# Patient Record
Sex: Female | Born: 1955 | Race: White | Hispanic: No | Marital: Married | Smoking: Former smoker
Health system: Southern US, Community
[De-identification: ages and names within clinical notes are randomized; demographics above are authoritative.]

## PROBLEM LIST (undated history)

## (undated) DIAGNOSIS — E079 Disorder of thyroid, unspecified: Secondary | ICD-10-CM

## (undated) DIAGNOSIS — F329 Major depressive disorder, single episode, unspecified: Secondary | ICD-10-CM

## (undated) DIAGNOSIS — F419 Anxiety disorder, unspecified: Secondary | ICD-10-CM

## (undated) DIAGNOSIS — T7840XA Allergy, unspecified, initial encounter: Secondary | ICD-10-CM

## (undated) DIAGNOSIS — F32A Depression, unspecified: Secondary | ICD-10-CM

## (undated) HISTORY — DX: Anxiety disorder, unspecified: F41.9

## (undated) HISTORY — PX: DILATION AND CURETTAGE OF UTERUS: SHX78

## (undated) HISTORY — DX: Depression, unspecified: F32.A

## (undated) HISTORY — DX: Allergy, unspecified, initial encounter: T78.40XA

## (undated) HISTORY — DX: Disorder of thyroid, unspecified: E07.9

## (undated) HISTORY — DX: Major depressive disorder, single episode, unspecified: F32.9

## (undated) HISTORY — PX: OTHER SURGICAL HISTORY: SHX169

## (undated) HISTORY — PX: VARICOSE VEIN SURGERY: SHX832

---

## 1997-10-29 ENCOUNTER — Ambulatory Visit (HOSPITAL_COMMUNITY): Admission: RE | Admit: 1997-10-29 | Discharge: 1997-10-29 | Payer: Self-pay | Admitting: Family Medicine

## 1999-03-17 ENCOUNTER — Encounter (INDEPENDENT_AMBULATORY_CARE_PROVIDER_SITE_OTHER): Payer: Self-pay | Admitting: Specialist

## 1999-03-17 ENCOUNTER — Ambulatory Visit (HOSPITAL_COMMUNITY): Admission: RE | Admit: 1999-03-17 | Discharge: 1999-03-17 | Payer: Self-pay | Admitting: Obstetrics and Gynecology

## 1999-12-06 ENCOUNTER — Emergency Department (HOSPITAL_COMMUNITY): Admission: EM | Admit: 1999-12-06 | Discharge: 1999-12-06 | Payer: Self-pay | Admitting: Emergency Medicine

## 2001-12-28 ENCOUNTER — Other Ambulatory Visit: Admission: RE | Admit: 2001-12-28 | Discharge: 2001-12-28 | Payer: Self-pay | Admitting: Gynecology

## 2003-01-17 ENCOUNTER — Other Ambulatory Visit: Admission: RE | Admit: 2003-01-17 | Discharge: 2003-01-17 | Payer: Self-pay | Admitting: Gynecology

## 2004-02-20 ENCOUNTER — Other Ambulatory Visit: Admission: RE | Admit: 2004-02-20 | Discharge: 2004-02-20 | Payer: Self-pay | Admitting: Gynecology

## 2004-04-23 ENCOUNTER — Ambulatory Visit: Payer: Self-pay | Admitting: Family Medicine

## 2004-05-07 ENCOUNTER — Ambulatory Visit: Payer: Self-pay | Admitting: Gastroenterology

## 2004-06-11 ENCOUNTER — Ambulatory Visit: Payer: Self-pay | Admitting: Gastroenterology

## 2004-06-25 ENCOUNTER — Ambulatory Visit: Payer: Self-pay | Admitting: Gastroenterology

## 2004-12-04 ENCOUNTER — Ambulatory Visit: Payer: Self-pay | Admitting: Family Medicine

## 2005-05-27 ENCOUNTER — Other Ambulatory Visit: Admission: RE | Admit: 2005-05-27 | Discharge: 2005-05-27 | Payer: Self-pay | Admitting: Gynecology

## 2005-08-17 ENCOUNTER — Ambulatory Visit: Payer: Self-pay | Admitting: Family Medicine

## 2006-07-07 ENCOUNTER — Other Ambulatory Visit: Admission: RE | Admit: 2006-07-07 | Discharge: 2006-07-07 | Payer: Self-pay | Admitting: Gynecology

## 2007-10-11 ENCOUNTER — Other Ambulatory Visit: Admission: RE | Admit: 2007-10-11 | Discharge: 2007-10-11 | Payer: Self-pay | Admitting: Gynecology

## 2007-10-11 ENCOUNTER — Ambulatory Visit: Payer: Self-pay | Admitting: Gynecology

## 2007-10-11 ENCOUNTER — Encounter: Payer: Self-pay | Admitting: Gynecology

## 2007-10-25 ENCOUNTER — Ambulatory Visit: Payer: Self-pay | Admitting: Gynecology

## 2008-01-25 ENCOUNTER — Ambulatory Visit: Payer: Self-pay | Admitting: Gynecology

## 2008-11-28 ENCOUNTER — Other Ambulatory Visit: Admission: RE | Admit: 2008-11-28 | Discharge: 2008-11-28 | Payer: Self-pay | Admitting: Gynecology

## 2008-11-28 ENCOUNTER — Encounter: Payer: Self-pay | Admitting: Gynecology

## 2008-11-28 ENCOUNTER — Ambulatory Visit: Payer: Self-pay | Admitting: Gynecology

## 2009-12-25 ENCOUNTER — Ambulatory Visit: Payer: Self-pay | Admitting: Gynecology

## 2009-12-25 ENCOUNTER — Other Ambulatory Visit: Admission: RE | Admit: 2009-12-25 | Discharge: 2009-12-25 | Payer: Self-pay | Admitting: Gynecology

## 2009-12-29 ENCOUNTER — Ambulatory Visit: Payer: Self-pay | Admitting: Gynecology

## 2010-01-29 ENCOUNTER — Ambulatory Visit: Payer: Self-pay | Admitting: Gynecology

## 2010-02-28 ENCOUNTER — Encounter: Payer: Self-pay | Admitting: Family Medicine

## 2010-03-02 ENCOUNTER — Encounter: Payer: Self-pay | Admitting: Gynecology

## 2010-06-26 NOTE — Op Note (Signed)
[facility]  Patient:    Nancy Chambers                          MRN: [id number] Proc. Date: 03/17/99 Adm. Date:  [id number] Attending:  Amanda Cockayne                           Operative Report  PREOPERATIVE DIAGNOSIS:       Heavy menstrual periods and she has had some questionable spotting.  She had hysterosonogram which showed endometrial lining was 14 mm and she had a posterior wall echogenic focus of 11 x 7 mm which was thought to be a polyp.  POSTOPERATIVE DIAGNOSIS:      Heavy menstrual periods and she has had some questionable spotting.  She had hysterosonogram which showed endometrial lining was 14 mm and she had a posterior wall echogenic focus of 11 x 7 mm which was thought to be a polyp.   Posterior polyp and thickened lining.  OPERATION:                    Hysteroscopy, polypectomy, and D&C.  SURGEON:                      Esmeralda Arthur, M.D.  ASSISTANT:  ANESTHESIA:                   General anesthesia.  PACKS:                        None.  CATHETERS:                    None.  MEDIUM:                       Sorbitol, deficit 90 cc.  FINDINGS:                     On bimanual the uterus was enlarged.  The uterus sounded to 8 cm.  She had an irregular endometrial cavity and a posterior polyp.  ESTIMATED BLOOD LOSS:  DESCRIPTION OF PROCEDURE:     The patient was carried to the operating room and  after satisfactory anesthesia, she was placed in the lithotomy position, prepped and draped, and the bladder was emptied by catheterization.  The legs were elevated more and plastic drape was placed on.  The cervix was then grasped with a single tooth tenaculum and sounded to 8 cm and then dilated to a #33 Hegar.  The observation scope was placed in.  I could see the irregular endometrium and a small polyp posteriorly.  Her tubal openings were visualized easily.  Then we did a sharp and serrated curettage and removed  the polyp.  The endometrial lining.  I looked again and could see no other abnormalities.  The procedure was terminated. DD:  03/17/99 TD:  03/17/99 Job: [id number]

## 2011-08-05 ENCOUNTER — Encounter: Payer: Self-pay | Admitting: Gynecology

## 2012-08-10 ENCOUNTER — Encounter: Payer: Self-pay | Admitting: Gynecology

## 2012-08-10 ENCOUNTER — Other Ambulatory Visit (HOSPITAL_COMMUNITY)
Admission: RE | Admit: 2012-08-10 | Discharge: 2012-08-10 | Disposition: A | Discharge status: Home / Self Care | Payer: 59 | Source: Ambulatory Visit | Attending: Gynecology | Admitting: Gynecology

## 2012-08-10 ENCOUNTER — Ambulatory Visit (INDEPENDENT_AMBULATORY_CARE_PROVIDER_SITE_OTHER): Payer: 59 | Admitting: Gynecology

## 2012-08-10 VITALS — BP 114/66 | Ht 66.0 in | Wt 189.0 lb

## 2012-08-10 DIAGNOSIS — Z01419 Encounter for gynecological examination (general) (routine) without abnormal findings: Secondary | ICD-10-CM | POA: Insufficient documentation

## 2012-08-10 DIAGNOSIS — Z1151 Encounter for screening for human papillomavirus (HPV): Secondary | ICD-10-CM | POA: Insufficient documentation

## 2012-08-10 LAB — TSH: TSH: 3.033 u[IU]/mL (ref 0.350–4.500)

## 2012-08-10 LAB — CBC WITH DIFFERENTIAL/PLATELET
Basophils Absolute: 0 10*3/uL (ref 0.0–0.1)
Basophils Relative: 1 % (ref 0–1)
Eosinophils Absolute: 0.2 10*3/uL (ref 0.0–0.7)
Eosinophils Relative: 4 % (ref 0–5)
HCT: 38.9 % (ref 36.0–46.0)
Hemoglobin: 13 g/dL (ref 12.0–15.0)
Lymphocytes Relative: 35 % (ref 12–46)
Lymphs Abs: 1.7 10*3/uL (ref 0.7–4.0)
MCH: 29.4 pg (ref 26.0–34.0)
MCHC: 33.4 g/dL (ref 30.0–36.0)
MCV: 88 fL (ref 78.0–100.0)
Monocytes Absolute: 0.3 10*3/uL (ref 0.1–1.0)
Monocytes Relative: 6 % (ref 3–12)
Neutro Abs: 2.7 10*3/uL (ref 1.7–7.7)
Neutrophils Relative %: 54 % (ref 43–77)
Platelets: 253 10*3/uL (ref 150–400)
RBC: 4.42 MIL/uL (ref 3.87–5.11)
RDW: 13.9 % (ref 11.5–15.5)
WBC: 5 10*3/uL (ref 4.0–10.5)

## 2012-08-10 LAB — COMPREHENSIVE METABOLIC PANEL
ALT: 13 U/L (ref 0–35)
AST: 14 U/L (ref 0–37)
Albumin: 4.4 g/dL (ref 3.5–5.2)
Alkaline Phosphatase: 56 U/L (ref 39–117)
BUN: 14 mg/dL (ref 6–23)
CO2: 28 mEq/L (ref 19–32)
Calcium: 9.3 mg/dL (ref 8.4–10.5)
Chloride: 104 mEq/L (ref 96–112)
Creat: 0.88 mg/dL (ref 0.50–1.10)
Glucose, Bld: 82 mg/dL (ref 70–99)
Potassium: 4.4 mEq/L (ref 3.5–5.3)
Sodium: 142 mEq/L (ref 135–145)
Total Bilirubin: 0.5 mg/dL (ref 0.3–1.2)
Total Protein: 6.3 g/dL (ref 6.0–8.3)

## 2012-08-10 NOTE — Addendum Note (Signed)
Addended by: Dayna Barker on: 08/10/2012 11:21 AM   Modules accepted: Orders

## 2012-08-10 NOTE — Patient Instructions (Addendum)
Follow up in one year for annual exam 

## 2012-08-10 NOTE — Progress Notes (Signed)
Nancy Chambers [birth year] [id number]        57 y.o.  G1P1001 for annual exam.  Doing well without complaints.  Past medical history,surgical history, medications, allergies, family history and social history were all reviewed and documented in the EPIC chart.  ROS:  Performed and pertinent positives and negatives are included in the history, assessment and plan .  Exam: Kim assistant Filed Vitals:   08/10/12 1039  BP: 114/66  Height: 5\' 6"  (1.676 m)  Weight: 189 lb (85.73 kg)   General appearance  Normal Skin grossly normal Head/Neck normal with no cervical or supraclavicular adenopathy thyroid normal Lungs  clear Cardiac RR, without RMG Abdominal  soft, nontender, without masses, organomegaly or hernia Breasts  examined lying and sitting without masses, retractions, discharge or axillary adenopathy. Pelvic  Ext/BUS/vagina  normal with mild atrophic changes  Cervix  normal with mild cervical stenosis noted with Pap  Uterus  anteverted, normal size, shape and contour, midline and mobile nontender   Adnexa  Without masses or tenderness    Anus and perineum  normal   Rectovaginal  normal sphincter tone without palpated masses or tenderness.    Assessment/Plan:  57 y.o. G94P1001 female for annual exam.   1. Postmenopausal. Patient doing well without bleeding or significant hot flashes, night sweats, vaginal dryness or dyspareunia. We'll continue to monitor. Patient knows to report any bleeding 2. Questionable height loss of 1/2 inch of the patient's not sure. We'll check baseline DEXA. Increase calcium vitamin D reviewed. 3. Pap smear 2011. Pap/HPV today. No history of significant abnormalities. 4. Mammography 07/2011. Patient scheduling repeat now. We'll continue with annual mammography. SBE monthly reviewed. 5. Colonoscopy 5 years ago. Recommended repeat in 10 years. 6. Health maintenance. Had recent lipid profile for insurance which was normal. Baseline CBC comprehensive metabolic  panel TSH vitamin D and urinalysis ordered.  Note: This document was prepared with digital dictation and possible smart phrase technology. Any transcriptional errors that result from this process are unintentional.   Dara Lords MD, 11:06 AM 08/10/2012

## 2012-08-11 LAB — URINALYSIS W MICROSCOPIC + REFLEX CULTURE
Bacteria, UA: NONE SEEN
Bilirubin Urine: NEGATIVE
Crystals: NONE SEEN
Hgb urine dipstick: NEGATIVE
Ketones, ur: NEGATIVE mg/dL
Nitrite: NEGATIVE
Specific Gravity, Urine: 1.023 (ref 1.005–1.030)
Urobilinogen, UA: 0.2 mg/dL (ref 0.0–1.0)

## 2012-08-11 LAB — VITAMIN D 25 HYDROXY (VIT D DEFICIENCY, FRACTURES): Vit D, 25-Hydroxy: 49 ng/mL (ref 30–89)

## 2012-08-23 ENCOUNTER — Telehealth: Payer: Self-pay | Admitting: *Deleted

## 2012-08-23 NOTE — Telephone Encounter (Signed)
Pt informed with all normal results on 08/10/12

## 2012-12-14 ENCOUNTER — Other Ambulatory Visit: Payer: Self-pay

## 2013-08-16 ENCOUNTER — Encounter: Payer: Self-pay | Admitting: Gynecology

## 2013-08-16 ENCOUNTER — Ambulatory Visit (INDEPENDENT_AMBULATORY_CARE_PROVIDER_SITE_OTHER): Payer: 59 | Admitting: Gynecology

## 2013-08-16 VITALS — BP 120/76 | Ht 66.0 in | Wt 206.0 lb

## 2013-08-16 DIAGNOSIS — Z01419 Encounter for gynecological examination (general) (routine) without abnormal findings: Secondary | ICD-10-CM

## 2013-08-16 LAB — CBC WITH DIFFERENTIAL/PLATELET
BASOS ABS: 0 10*3/uL (ref 0.0–0.1)
Basophils Relative: 1 % (ref 0–1)
EOS PCT: 3 % (ref 0–5)
Eosinophils Absolute: 0.1 10*3/uL (ref 0.0–0.7)
HEMATOCRIT: 38.1 % (ref 36.0–46.0)
HEMOGLOBIN: 13.1 g/dL (ref 12.0–15.0)
LYMPHS PCT: 45 % (ref 12–46)
Lymphs Abs: 2 10*3/uL (ref 0.7–4.0)
MCH: 30 pg (ref 26.0–34.0)
MCHC: 34.4 g/dL (ref 30.0–36.0)
MCV: 87.4 fL (ref 78.0–100.0)
MONO ABS: 0.3 10*3/uL (ref 0.1–1.0)
MONOS PCT: 6 % (ref 3–12)
Neutro Abs: 2 10*3/uL (ref 1.7–7.7)
Neutrophils Relative %: 45 % (ref 43–77)
Platelets: 223 10*3/uL (ref 150–400)
RBC: 4.36 MIL/uL (ref 3.87–5.11)
RDW: 14.1 % (ref 11.5–15.5)
WBC: 4.4 10*3/uL (ref 4.0–10.5)

## 2013-08-16 NOTE — Progress Notes (Signed)
Nancy HarriesRobin C Chambers 10/08/1955 [id number]        58 y.o.  G1P1001 for annual exam.  Several issues noted below.  Past medical history,surgical history, problem list, medications, allergies, family history and social history were all reviewed and documented as reviewed in the EPIC chart.  ROS:  12 system ROS performed with pertinent positives and negatives included in the history, assessment and plan.   Additional significant findings :  None   Exam: [facility] personassistant Filed Vitals:   08/16/13 1446  BP: 120/76  Height: 5\' 6"  (1.676 m)  Weight: 206 lb (93.441 kg)   General appearance:  Normal affect, orientation and appearance. Skin: Grossly normal HEENT: Without gross lesions.  No cervical or supraclavicular adenopathy. Thyroid normal.  Lungs:  Clear without wheezing, rales or rhonchi Cardiac: RR, without RMG Abdominal:  Soft, nontender, without masses, guarding, rebound, organomegaly or hernia Breasts:  Examined lying and sitting without masses, retractions, discharge or axillary adenopathy. Pelvic:  Ext/BUS/vagina with generalized atrophic change in  Cervix atrophic.  Uterus anteverted, normal size, shape and contour, midline and mobile nontender   Adnexa  Without masses or tenderness    Anus and perineum  Normal   Rectovaginal  Normal sphincter tone without palpated masses or tenderness.    Assessment/Plan:  58 y.o. 561P1001 female for annual exam.   1. Postmenopausal/atrophic genital changes. Patient without significant symptoms of hot flushes, night sweats, vaginal dryness. Is not currently sexually active. No vaginal bleeding. Continue to monitor. Call if any vaginal bleeding. 2. Pap smear/HPV negative 2014. No Pap smear done today. No history of significant abnormal Pap smears. Plan repeat at 3-5 year interval per current screening guidelines. 3. Mammography 2013. Patient knows she's overdue and agrees to call and schedule. SBE monthly reviewed. 4. DEXA 9 years ago. Was to schedule last  year but failed to followup with this. Reports a half an inch height loss. Plan DEXA now. 5. Colonoscopy 6 years ago. Repeat at their recommended interval. 6. Health maintenance. Baseline CBC comprehensive metabolic panel lipid profile urinalysis TSH vitamin D ordered. Followup for bone density otherwise one year, sooner if any issues.   Note: This document was prepared with digital dictation and possible smart phrase technology. Any transcriptional errors that result from this process are unintentional.   Dara LordsFONTAINE,Gulianna Hornsby P MD, 3:09 PM 08/16/2013

## 2013-08-16 NOTE — Patient Instructions (Signed)
Followup in one year for annual exam, sooner if issues.  You may obtain a copy of any labs that were done today by logging onto MyChart as outlined in the instructions provided with your AVS (after visit summary). The office will not call with normal lab results but certainly if there are any significant abnormalities then we will contact you.   Health Maintenance, Female A healthy lifestyle and preventative care can promote health and wellness.  Maintain regular health, dental, and eye exams.  Eat a healthy diet. Foods like vegetables, fruits, whole grains, low-fat dairy products, and lean protein foods contain the nutrients you need without too many calories. Decrease your intake of foods high in solid fats, added sugars, and salt. Get information about a proper diet from your caregiver, if necessary.  Regular physical exercise is one of the most important things you can do for your health. Most adults should get at least 150 minutes of moderate-intensity exercise (any activity that increases your heart rate and causes you to sweat) each week. In addition, most adults need muscle-strengthening exercises on 2 or more days a week.   Maintain a healthy weight. The body mass index (BMI) is a screening tool to identify possible weight problems. It provides an estimate of body fat based on height and weight. Your caregiver can help determine your BMI, and can help you achieve or maintain a healthy weight. For adults 20 years and older:  A BMI below 18.5 is considered underweight.  A BMI of 18.5 to 24.9 is normal.  A BMI of 25 to 29.9 is considered overweight.  A BMI of 30 and above is considered obese.  Maintain normal blood lipids and cholesterol by exercising and minimizing your intake of saturated fat. Eat a balanced diet with plenty of fruits and vegetables. Blood tests for lipids and cholesterol should begin at age 4 and be repeated every 5 years. If your lipid or cholesterol levels are  high, you are over 50, or you are a high risk for heart disease, you may need your cholesterol levels checked more frequently.Ongoing high lipid and cholesterol levels should be treated with medicines if diet and exercise are not effective.  If you smoke, find out from your caregiver how to quit. If you do not use tobacco, do not start.  Lung cancer screening is recommended for adults aged 61 80 years who are at high risk for developing lung cancer because of a history of smoking. Yearly low-dose computed tomography (CT) is recommended for people who have at least a 30-pack-year history of smoking and are a current smoker or have quit within the past 15 years. A pack year of smoking is smoking an average of 1 pack of cigarettes a day for 1 year (for example: 1 pack a day for 30 years or 2 packs a day for 15 years). Yearly screening should continue until the smoker has stopped smoking for at least 15 years. Yearly screening should also be stopped for people who develop a health problem that would prevent them from having lung cancer treatment.  If you are pregnant, do not drink alcohol. If you are breastfeeding, be very cautious about drinking alcohol. If you are not pregnant and choose to drink alcohol, do not exceed 1 drink per day. One drink is considered to be 12 ounces (355 mL) of beer, 5 ounces (148 mL) of wine, or 1.5 ounces (44 mL) of liquor.  Avoid use of street drugs. Do not share needles with anyone.  Ask for help if you need support or instructions about stopping the use of drugs.  High blood pressure causes heart disease and increases the risk of stroke. Blood pressure should be checked at least every 1 to 2 years. Ongoing high blood pressure should be treated with medicines, if weight loss and exercise are not effective.  If you are 88 to 56 years old, ask your caregiver if you should take aspirin to prevent strokes.  Diabetes screening involves taking a blood sample to check your fasting  blood sugar level. This should be done once every 3 years, after age 77, if you are within normal weight and without risk factors for diabetes. Testing should be considered at a younger age or be carried out more frequently if you are overweight and have at least 1 risk factor for diabetes.  Breast cancer screening is essential preventative care for women. You should practice "breast self-awareness." This means understanding the normal appearance and feel of your breasts and may include breast self-examination. Any changes detected, no matter how small, should be reported to a caregiver. Women in their 26s and 30s should have a clinical breast exam (CBE) by a caregiver as part of a regular health exam every 1 to 3 years. After age 32, women should have a CBE every year. Starting at age 34, women should consider having a mammogram (breast X-ray) every year. Women who have a family history of breast cancer should talk to their caregiver about genetic screening. Women at a high risk of breast cancer should talk to their caregiver about having an MRI and a mammogram every year.  Breast cancer gene (BRCA)-related cancer risk assessment is recommended for women who have family members with BRCA-related cancers. BRCA-related cancers include breast, ovarian, tubal, and peritoneal cancers. Having family members with these cancers may be associated with an increased risk for harmful changes (mutations) in the breast cancer genes BRCA1 and BRCA2. Results of the assessment will determine the need for genetic counseling and BRCA1 and BRCA2 testing.  The Pap test is a screening test for cervical cancer. Women should have a Pap test starting at age 28. Between ages 48 and 67, Pap tests should be repeated every 2 years. Beginning at age 20, you should have a Pap test every 3 years as long as the past 3 Pap tests have been normal. If you had a hysterectomy for a problem that was not cancer or a condition that could lead to  cancer, then you no longer need Pap tests. If you are between ages 55 and 63, and you have had normal Pap tests going back 10 years, you no longer need Pap tests. If you have had past treatment for cervical cancer or a condition that could lead to cancer, you need Pap tests and screening for cancer for at least 20 years after your treatment. If Pap tests have been discontinued, risk factors (such as a new sexual partner) need to be reassessed to determine if screening should be resumed. Some women have medical problems that increase the chance of getting cervical cancer. In these cases, your caregiver may recommend more frequent screening and Pap tests.  The human papillomavirus (HPV) test is an additional test that may be used for cervical cancer screening. The HPV test looks for the virus that can cause the cell changes on the cervix. The cells collected during the Pap test can be tested for HPV. The HPV test could be used to screen women aged 28 years  and older, and should be used in women of any age who have unclear Pap test results. After the age of 85, women should have HPV testing at the same frequency as a Pap test.  Colorectal cancer can be detected and often prevented. Most routine colorectal cancer screening begins at the age of 61 and continues through age 55. However, your caregiver may recommend screening at an earlier age if you have risk factors for colon cancer. On a yearly basis, your caregiver may provide home test kits to check for hidden blood in the stool. Use of a small camera at the end of a tube, to directly examine the colon (sigmoidoscopy or colonoscopy), can detect the earliest forms of colorectal cancer. Talk to your caregiver about this at age 56, when routine screening begins. Direct examination of the colon should be repeated every 5 to 10 years through age 20, unless early forms of pre-cancerous polyps or small growths are found.  Hepatitis C blood testing is recommended for  all people born from [birth year] through 1965 and any individual with known risks for hepatitis C.  Practice safe sex. Use condoms and avoid high-risk sexual practices to reduce the spread of sexually transmitted infections (STIs). Sexually active women aged 68 and younger should be checked for Chlamydia, which is a common sexually transmitted infection. Older women with new or multiple partners should also be tested for Chlamydia. Testing for other STIs is recommended if you are sexually active and at increased risk.  Osteoporosis is a disease in which the bones lose minerals and strength with aging. This can result in serious bone fractures. The risk of osteoporosis can be identified using a bone density scan. Women ages 42 and over and women at risk for fractures or osteoporosis should discuss screening with their caregivers. Ask your caregiver whether you should be taking a calcium supplement or vitamin D to reduce the rate of osteoporosis.  Menopause can be associated with physical symptoms and risks. Hormone replacement therapy is available to decrease symptoms and risks. You should talk to your caregiver about whether hormone replacement therapy is right for you.  Use sunscreen. Apply sunscreen liberally and repeatedly throughout the day. You should seek shade when your shadow is shorter than you. Protect yourself by wearing long sleeves, pants, a wide-brimmed hat, and sunglasses year round, whenever you are outdoors.  Notify your caregiver of new moles or changes in moles, especially if there is a change in shape or color. Also notify your caregiver if a mole is larger than the size of a pencil eraser.  Stay current with your immunizations. Document Released: 08/10/2010 Document Revised: 05/22/2012 Document Reviewed: 08/10/2010 [facility] Patient Information 2014 [place].

## 2013-08-17 ENCOUNTER — Other Ambulatory Visit: Payer: Self-pay | Admitting: Gynecology

## 2013-08-17 DIAGNOSIS — E7800 Pure hypercholesterolemia, unspecified: Secondary | ICD-10-CM

## 2013-08-17 LAB — URINALYSIS W MICROSCOPIC + REFLEX CULTURE
BILIRUBIN URINE: NEGATIVE
Bacteria, UA: NONE SEEN
CASTS: NONE SEEN
Crystals: NONE SEEN
GLUCOSE, UA: NEGATIVE mg/dL
HGB URINE DIPSTICK: NEGATIVE
Ketones, ur: NEGATIVE mg/dL
Leukocytes, UA: NEGATIVE
Nitrite: NEGATIVE
PH: 5.5 (ref 5.0–8.0)
PROTEIN: NEGATIVE mg/dL
Specific Gravity, Urine: 1.02 (ref 1.005–1.030)
Squamous Epithelial / LPF: NONE SEEN
Urobilinogen, UA: 0.2 mg/dL (ref 0.0–1.0)

## 2013-08-17 LAB — COMPREHENSIVE METABOLIC PANEL
ALBUMIN: 4.4 g/dL (ref 3.5–5.2)
ALT: 23 U/L (ref 0–35)
AST: 21 U/L (ref 0–37)
Alkaline Phosphatase: 70 U/L (ref 39–117)
BUN: 12 mg/dL (ref 6–23)
CALCIUM: 9.4 mg/dL (ref 8.4–10.5)
CHLORIDE: 105 meq/L (ref 96–112)
CO2: 26 meq/L (ref 19–32)
CREATININE: 0.95 mg/dL (ref 0.50–1.10)
GLUCOSE: 95 mg/dL (ref 70–99)
Potassium: 3.9 mEq/L (ref 3.5–5.3)
Sodium: 141 mEq/L (ref 135–145)
Total Bilirubin: 0.6 mg/dL (ref 0.2–1.2)
Total Protein: 6.2 g/dL (ref 6.0–8.3)

## 2013-08-17 LAB — LIPID PANEL
CHOL/HDL RATIO: 4 ratio
CHOLESTEROL: 219 mg/dL — AB (ref 0–200)
HDL: 55 mg/dL (ref >39)
LDL Cholesterol: 145 mg/dL — ABNORMAL HIGH (ref 0–99)
TRIGLYCERIDES: 97 mg/dL (ref <150)
VLDL: 19 mg/dL (ref 0–40)

## 2013-08-17 LAB — VITAMIN D 25 HYDROXY (VIT D DEFICIENCY, FRACTURES): VIT D 25 HYDROXY: 58 ng/mL (ref 30–89)

## 2013-08-17 LAB — TSH: TSH: 2.633 u[IU]/mL (ref 0.350–4.500)

## 2013-09-05 ENCOUNTER — Encounter: Payer: Self-pay | Admitting: Gynecology

## 2013-09-13 ENCOUNTER — Ambulatory Visit (INDEPENDENT_AMBULATORY_CARE_PROVIDER_SITE_OTHER): Payer: 59

## 2013-09-13 ENCOUNTER — Ambulatory Visit (INDEPENDENT_AMBULATORY_CARE_PROVIDER_SITE_OTHER): Payer: 59 | Admitting: Gynecology

## 2013-09-13 ENCOUNTER — Other Ambulatory Visit: Payer: 59

## 2013-09-13 ENCOUNTER — Other Ambulatory Visit: Payer: Self-pay | Admitting: Gynecology

## 2013-09-13 ENCOUNTER — Encounter: Payer: Self-pay | Admitting: Gynecology

## 2013-09-13 DIAGNOSIS — Z01419 Encounter for gynecological examination (general) (routine) without abnormal findings: Secondary | ICD-10-CM

## 2013-09-13 DIAGNOSIS — R102 Pelvic and perineal pain: Secondary | ICD-10-CM

## 2013-09-13 DIAGNOSIS — E7800 Pure hypercholesterolemia, unspecified: Secondary | ICD-10-CM

## 2013-09-13 DIAGNOSIS — N898 Other specified noninflammatory disorders of vagina: Secondary | ICD-10-CM

## 2013-09-13 DIAGNOSIS — Z13820 Encounter for screening for osteoporosis: Secondary | ICD-10-CM

## 2013-09-13 DIAGNOSIS — M8580 Other specified disorders of bone density and structure, unspecified site: Secondary | ICD-10-CM

## 2013-09-13 DIAGNOSIS — N9489 Other specified conditions associated with female genital organs and menstrual cycle: Secondary | ICD-10-CM

## 2013-09-13 LAB — URINALYSIS W MICROSCOPIC + REFLEX CULTURE
Bilirubin Urine: NEGATIVE
Casts: NONE SEEN
Crystals: NONE SEEN
GLUCOSE, UA: NEGATIVE mg/dL
HGB URINE DIPSTICK: NEGATIVE
Nitrite: NEGATIVE
PROTEIN: NEGATIVE mg/dL
RBC / HPF: NONE SEEN RBC/hpf (ref <3)
Specific Gravity, Urine: 1.02 (ref 1.005–1.030)
Urobilinogen, UA: 0.2 mg/dL (ref 0.0–1.0)
pH: 6 (ref 5.0–8.0)

## 2013-09-13 LAB — WET PREP FOR TRICH, YEAST, CLUE
CLUE CELLS WET PREP: NONE SEEN
Trich, Wet Prep: NONE SEEN
YEAST WET PREP: NONE SEEN

## 2013-09-13 MED ORDER — FLUCONAZOLE 150 MG PO TABS: 150.0000 mg | ORAL_TABLET | Freq: Once | ORAL | Status: DC

## 2013-09-13 NOTE — Patient Instructions (Signed)
Take Diflucan pill today and repeat it in 2 days. Followup if your symptoms persist, worsen or recur.

## 2013-09-13 NOTE — Progress Notes (Signed)
Nancy HarriesRobin C Chambers 01/17/1956 [id number]        58 y.o.  G1P1001 presents having just been examined a month ago for her annual exam dating a slight vaginal discharge and pelvic pressure over the last week or 2 as if something is dropping. No frequency dysuria urgency or low back pain. No constipation diarrhea. No vaginal odor or significant itching.  Past medical history,surgical history, problem list, medications, allergies, family history and social history were all reviewed and documented in the EPIC chart.  Directed ROS with pertinent positives and negatives documented in the history of present illness/assessment and plan.  Exam: Kim assistant General appearance:  Normal Spine straight without CVA tenderness Abdomen soft nontender without masses guarding rebound organomegaly Pelvic external BUS vagina with cakey white discharge. Cervix normal. Uterus normal size midline mobile nontender. Adnexa without masses or tenderness. Rectal exam is normal.  Assessment/Plan:  58 y.o. G1P1001 with pelvic pressure and slight discharge symptoms. Urinalysis shows few bacteria and few WBC. Will wait for urine culture and treat if needed. Vaginal discharge appears as yeast although the wet prep was negative. Going to cover her as a yeast vaginitis with Diflucan 150 mg every other day x2 doses. Followup if symptoms persist, worsen or recur.   Note: This document was prepared with digital dictation and possible smart phrase technology. Any transcriptional errors that result from this process are unintentional.   Dara LordsFONTAINE,Auburn Hert P MD, 3:09 PM 09/13/2013

## 2013-09-14 LAB — LIPID PANEL
CHOLESTEROL: 243 mg/dL — AB (ref 0–200)
HDL: 58 mg/dL (ref >39)
LDL Cholesterol: 166 mg/dL — ABNORMAL HIGH (ref 0–99)
Total CHOL/HDL Ratio: 4.2 Ratio
Triglycerides: 93 mg/dL (ref <150)
VLDL: 19 mg/dL (ref 0–40)

## 2013-09-15 LAB — URINE CULTURE

## 2013-09-18 ENCOUNTER — Other Ambulatory Visit: Payer: Self-pay | Admitting: Gynecology

## 2013-09-18 DIAGNOSIS — Z13820 Encounter for screening for osteoporosis: Secondary | ICD-10-CM

## 2013-11-23 ENCOUNTER — Other Ambulatory Visit: Payer: Self-pay

## 2013-12-10 ENCOUNTER — Encounter: Payer: Self-pay | Admitting: Gynecology

## 2013-12-12 ENCOUNTER — Telehealth: Payer: Self-pay | Admitting: Family Medicine

## 2013-12-12 NOTE — Telephone Encounter (Signed)
Spoke with pt and she is seeing dr. Charm BargesButler. Pt having dizzy spells for 2 weeks. Pt wanted appt for today. Pt aware no appt available for today.

## 2014-01-30 ENCOUNTER — Encounter: Payer: Self-pay | Admitting: Family Medicine

## 2014-01-30 ENCOUNTER — Encounter: Payer: Self-pay | Admitting: *Deleted

## 2014-01-30 ENCOUNTER — Ambulatory Visit (INDEPENDENT_AMBULATORY_CARE_PROVIDER_SITE_OTHER): Payer: 59 | Admitting: Family Medicine

## 2014-01-30 VITALS — BP 118/72 | HR 100 | Temp 97.8°F | Ht 66.0 in | Wt 211.0 lb

## 2014-01-30 DIAGNOSIS — J0120 Acute ethmoidal sinusitis, unspecified: Secondary | ICD-10-CM

## 2014-01-30 DIAGNOSIS — R519 Headache, unspecified: Secondary | ICD-10-CM

## 2014-01-30 DIAGNOSIS — R51 Headache: Secondary | ICD-10-CM

## 2014-01-30 DIAGNOSIS — R059 Cough, unspecified: Secondary | ICD-10-CM

## 2014-01-30 DIAGNOSIS — R05 Cough: Secondary | ICD-10-CM

## 2014-01-30 MED ORDER — AMOXICILLIN 500 MG PO CAPS: 500.0000 mg | ORAL_CAPSULE | Freq: Three times a day (TID) | ORAL | Status: DC

## 2014-01-30 NOTE — Patient Instructions (Addendum)
Drink plenty of fluids Watch caffeine and milk cheese ice cream and dairy products Take Tylenol for aches pains and fever Use Mucinex, maximum strength, blue and white in color, 1 twice daily with a large glass of water for cough and congestion Use saline nose spray Keep the house cooler Take antibiotic as directed  VoiceRest and remain out of work tomorrow

## 2014-01-30 NOTE — Progress Notes (Signed)
Subjective:    Patient ID: Nancy Chambers, female    DOB: 07/27/1955, 58 y.o.   MRN: [id number]  HPI Patient here today for a cold that started this past Saturday. Her complaints today include hoarseness, cough, and congestion. She had a cold which started 4 days ago. The patient has also had some headache and chills. She comes to the visit today with her husband.  There are no active problems to display for this patient.  Outpatient Encounter Prescriptions as of 01/30/2014  Medication Sig  . cetirizine (ZYRTEC) 10 MG tablet Take 10 mg by mouth daily.  . Cholecalciferol (VITAMIN D PO) Take by mouth.  Chilton Si. Green Tea, Camillia sinensis, (GREEN TEA EXTRACT PO) Take by mouth.  . LECITHIN PO Take by mouth.  . Multiple Vitamin (MULTIVITAMIN) tablet Take 1 tablet by mouth daily.  . phentermine 37.5 MG capsule Take 37.5 mg by mouth every morning.  . [DISCONTINUED] fluconazole (DIFLUCAN) 150 MG tablet Take 1 tablet (150 mg total) by mouth once.    Review of Systems  Constitutional: Negative.   HENT: Positive for congestion and voice change.   Eyes: Negative.   Respiratory: Positive for cough.   Cardiovascular: Negative.   Gastrointestinal: Negative.   Endocrine: Negative.   Genitourinary: Negative.   Musculoskeletal: Negative.   Skin: Negative.   Allergic/Immunologic: Negative.   Neurological: Negative.   Hematological: Negative.   Psychiatric/Behavioral: Negative.        Objective:   Physical Exam  Constitutional: She is oriented to person, place, and time. She appears well-developed and well-nourished. No distress.  HENT:  Head: Normocephalic and atraumatic.  Right Ear: External ear normal.  Left Ear: External ear normal.  Mouth/Throat: Oropharynx is clear and moist. No oropharyngeal exudate.  Some ethmoid sinus tenderness and mild right maxillary tenderness there is nasal congestion bilaterally and the throat is slightly red posteriorly  Eyes: Conjunctivae and EOM are normal.  Pupils are equal, round, and reactive to light. Right eye exhibits no discharge. Left eye exhibits no discharge. No scleral icterus.  Neck: Normal range of motion. Neck supple. No thyromegaly present.  No anterior cervical nodes  Cardiovascular: Normal rate, regular rhythm and normal heart sounds.   No murmur heard. At 72/m  Pulmonary/Chest: Effort normal and breath sounds normal. No respiratory distress. She has no wheezes. She has no rales. She exhibits no tenderness.  Dry cough  Musculoskeletal: Normal range of motion.  Lymphadenopathy:    She has no cervical adenopathy.  Neurological: She is alert and oriented to person, place, and time.  Skin: Skin is warm and dry. No rash noted.  Psychiatric: She has a normal mood and affect. Her behavior is normal. Judgment and thought content normal.  Nursing note and vitals reviewed.  BP 118/72 mmHg  Pulse 100  Temp(Src) 97.8 F (36.6 C) (Oral)  Ht 5\' 6"  (1.676 m)  Wt 211 lb (95.709 kg)  BMI 34.07 kg/m2        Assessment & Plan:  1. Acute ethmoidal sinusitis, recurrence not specified - amoxicillin (AMOXIL) 500 MG capsule; Take 1 capsule (500 mg total) by mouth 3 (three) times daily.  Dispense: 30 capsule; Refill: 0  2. Cough - amoxicillin (AMOXIL) 500 MG capsule; Take 1 capsule (500 mg total) by mouth 3 (three) times daily.  Dispense: 30 capsule; Refill: 0  3. Nonintractable headache, unspecified chronicity pattern, unspecified headache type  Patient Instructions  Drink plenty of fluids Watch caffeine and milk cheese ice cream and dairy products  Take Tylenol for aches pains and fever Use Mucinex, maximum strength, blue and white in color, 1 twice daily with a large glass of water for cough and congestion Use saline nose spray Keep the house cooler Take antibiotic as directed  VoiceRest and remain out of work tomorrow   Nancy Capeson W. Aariana Shankland MD

## 2014-04-04 ENCOUNTER — Ambulatory Visit (INDEPENDENT_AMBULATORY_CARE_PROVIDER_SITE_OTHER): Payer: 59 | Admitting: Family Medicine

## 2014-04-04 ENCOUNTER — Telehealth: Payer: Self-pay

## 2014-04-04 ENCOUNTER — Encounter: Payer: Self-pay | Admitting: Family Medicine

## 2014-04-04 VITALS — BP 100/68 | HR 75 | Temp 97.0°F | Ht 66.0 in | Wt 210.0 lb

## 2014-04-04 DIAGNOSIS — Z0000 Encounter for general adult medical examination without abnormal findings: Secondary | ICD-10-CM

## 2014-04-04 LAB — POCT GLYCOSYLATED HEMOGLOBIN (HGB A1C): Hemoglobin A1C: 5.1

## 2014-04-04 NOTE — Progress Notes (Signed)
   Subjective:    Patient ID: Nancy Chambers, female    DOB: February 12, 1955, 59 y.o.   MRN: [id number]  HPI 59 year old female here for physical at the request of her health insurance carrier. She is on no prescription medicines. Her last LDL cholesterol was elevated at 166 but she opted for herbal remedies and behavior modification. She has a history of some inner ear issues with dizziness but that is not a current complaint.  There are no active problems to display for this patient.  Outpatient Encounter Prescriptions as of 04/04/2014  Medication Sig  . cetirizine (ZYRTEC) 10 MG tablet Take 10 mg by mouth daily.  . Cholecalciferol (VITAMIN D PO) Take by mouth.  Nyoka Cowden Tea, Camillia sinensis, (GREEN TEA EXTRACT PO) Take by mouth.  . LECITHIN PO Take by mouth.  . Multiple Vitamin (MULTIVITAMIN) tablet Take 1 tablet by mouth daily.  . [DISCONTINUED] amoxicillin (AMOXIL) 500 MG capsule Take 1 capsule (500 mg total) by mouth 3 (three) times daily.  . [DISCONTINUED] phentermine 37.5 MG capsule Take 37.5 mg by mouth every morning.     Review of Systems  Constitutional: Negative.   HENT: Negative.   Eyes: Negative.   Respiratory: Negative.   Cardiovascular: Negative.   Gastrointestinal: Negative.   Endocrine: Negative.   Genitourinary: Negative.   Hematological: Negative.   Psychiatric/Behavioral: Negative.        Objective:   Physical Exam  Constitutional: She is oriented to person, place, and time. She appears well-developed and well-nourished.  Eyes: Conjunctivae and EOM are normal.  Neck: Normal range of motion. Neck supple.  Cardiovascular: Normal rate, regular rhythm and normal heart sounds.   Pulmonary/Chest: Effort normal and breath sounds normal.  Abdominal: Soft. Bowel sounds are normal.  Genitourinary:  She is up-to-date with Pap smears and mammograms and bone density  Musculoskeletal: Normal range of motion.  Neurological: She is alert and oriented to person, place, and  time. She has normal reflexes.  Skin: Skin is warm and dry.  Psychiatric: She has a normal mood and affect. Her behavior is normal. Thought content normal.    BP 100/68 mmHg  Pulse 75  Temp(Src) 97 F (36.1 C) (Oral)  Ht _0  (1.676 m)  Wt 210 lb (95.255 kg)  BMI 33.91 kg/m2       Assessment & Plan:  1. Healthcare maintenance Normal exam except overweight.  Discussed wt loss  Wardell Honour MD - POCT glycosylated hemoglobin (Hb A1C) - Lipid panel - BMP8+EGFR

## 2014-04-05 LAB — BMP8+EGFR
BUN/Creatinine Ratio: 13 (ref 9–23)
BUN: 11 mg/dL (ref 6–24)
CALCIUM: 9.2 mg/dL (ref 8.7–10.2)
CO2: 22 mmol/L (ref 18–29)
Chloride: 105 mmol/L (ref 97–108)
Creatinine, Ser: 0.87 mg/dL (ref 0.57–1.00)
GFR calc non Af Amer: 74 mL/min/{1.73_m2} (ref >59)
GFR, EST AFRICAN AMERICAN: 86 mL/min/{1.73_m2} (ref >59)
GLUCOSE: 93 mg/dL (ref 65–99)
Potassium: 4.1 mmol/L (ref 3.5–5.2)
Sodium: 144 mmol/L (ref 134–144)

## 2014-04-05 LAB — LIPID PANEL
Chol/HDL Ratio: 3.9 ratio units (ref 0.0–4.4)
Cholesterol, Total: 233 mg/dL — ABNORMAL HIGH (ref 100–199)
HDL: 60 mg/dL (ref >39)
LDL Calculated: 142 mg/dL — ABNORMAL HIGH (ref 0–99)
Triglycerides: 155 mg/dL — ABNORMAL HIGH (ref 0–149)
VLDL CHOLESTEROL CAL: 31 mg/dL (ref 5–40)

## 2014-09-09 ENCOUNTER — Ambulatory Visit (INDEPENDENT_AMBULATORY_CARE_PROVIDER_SITE_OTHER): Payer: 59 | Admitting: Family Medicine

## 2014-09-09 ENCOUNTER — Encounter: Payer: Self-pay | Admitting: Family Medicine

## 2014-09-09 VITALS — BP 112/65 | HR 73 | Temp 97.2°F | Ht 66.0 in | Wt 212.2 lb

## 2014-09-09 DIAGNOSIS — F329 Major depressive disorder, single episode, unspecified: Secondary | ICD-10-CM

## 2014-09-09 DIAGNOSIS — R5383 Other fatigue: Secondary | ICD-10-CM

## 2014-09-09 DIAGNOSIS — F32A Depression, unspecified: Secondary | ICD-10-CM

## 2014-09-09 DIAGNOSIS — R42 Dizziness and giddiness: Secondary | ICD-10-CM

## 2014-09-09 LAB — POCT CBC
Granulocyte percent: 54.8 %G (ref 37–80)
HCT, POC: 42.9 % (ref 37.7–47.9)
HEMOGLOBIN: 12.9 g/dL (ref 12.2–16.2)
Lymph, poc: 1.8 (ref 0.6–3.4)
MCH, POC: 26.7 pg — AB (ref 27–31.2)
MCHC: 30.2 g/dL — AB (ref 31.8–35.4)
MCV: 88.5 fL (ref 80–97)
MPV: 6.9 fL (ref 0–99.8)
POC GRANULOCYTE: 2.6 (ref 2–6.9)
POC LYMPH %: 38.7 % (ref 10–50)
Platelet Count, POC: 267 10*3/uL (ref 142–424)
RBC: 4.85 M/uL (ref 4.04–5.48)
RDW, POC: 14.2 %
WBC: 4.7 10*3/uL (ref 4.6–10.2)

## 2014-09-09 MED ORDER — DULOXETINE HCL 30 MG PO CPEP: 30.0000 mg | ORAL_CAPSULE | Freq: Every day | ORAL | Status: DC

## 2014-09-09 NOTE — Progress Notes (Signed)
Subjective:  Patient ID: Nancy Chambers, female    DOB: Dec 13, 1955  Age: 59 y.o. MRN: [id number]  CC: Dizziness and Nausea   HPI Nancy Chambers presents for nausea without vomiting. The nausea has been going on for 2-3 days. It is associated with dizziness. The onset of dizziness was 3 days ago when she was standing and just suddenly felt like she was leaning off to her side and about to fall. Since then she's had a lightheadedness without a room spinning sensation. She has continued to have some nausea. She has rare heartburn but nothing associated with the dizziness or nausea. Additionally she mentions that she is under tremendous stress. Her depression survey score is attached showing a small red or severe depression. There are thoughts of feeling bad about herself thoughts of moving and speaking slowly and actually feeling that she might be better off dead nearly every day. See that attached survey. Patient is menopausal she does not have hot flashes she is not having any vaginal bleeding. She has  had minimal melena when she eats chocolate ice cream. She is not having bloody stools. She has not been vomiting. She has a good appetite in fact she is concerned that she eats too much. She does mention feeling cold a lot. She does have chronic constipation but it has not been worse recently. She is concerned that her eyeglasses may need to be changed and the cause of dizziness. She does have stuffy nose a lot.  History Nancy Chambers has a past medical history of Allergy; Depression; and Anxiety.   She has past surgical history that includes Dilation and curettage of uterus; Varicose vein surgery; and Broken nose surg.   Her family history includes Heart attack in her father and mother; Hypertension in her mother; Liver cancer in her mother; Lung cancer in her sister; Pancreatic cancer in her mother; Stroke in her mother.She reports that she has quit smoking. She has never used smokeless tobacco. She reports that  she does not drink alcohol or use illicit drugs.  Outpatient Prescriptions Prior to Visit  Medication Sig Dispense Refill  . Green Tea, Camillia sinensis, (GREEN TEA EXTRACT PO) Take by mouth.    . LECITHIN PO Take by mouth.    . Multiple Vitamin (MULTIVITAMIN) tablet Take 1 tablet by mouth daily.    . cetirizine (ZYRTEC) 10 MG tablet Take 10 mg by mouth daily.    . Cholecalciferol (VITAMIN D PO) Take by mouth.     No facility-administered medications prior to visit.    ROS Review of Systems  Objective:  BP 112/65 mmHg  Pulse 73  Temp(Src) 97.2 F (36.2 C) (Oral)  Ht 5' 6"  (1.676 m)  Wt 212 lb 3.2 oz (96.253 kg)  BMI 34.27 kg/m2  BP Readings from Last 3 Encounters:  09/09/14 112/65  04/04/14 100/68  01/30/14 118/72    Wt Readings from Last 3 Encounters:  09/09/14 212 lb 3.2 oz (96.253 kg)  04/04/14 210 lb (95.255 kg)  01/30/14 211 lb (95.709 kg)     Physical Exam  Lab Results  Component Value Date   HGBA1C 5.1 04/04/2014    Lab Results  Component Value Date   WBC 4.7 09/09/2014   HGB 12.9 09/09/2014   HCT 42.9 09/09/2014   PLT 223 08/16/2013   GLUCOSE 93 04/04/2014   CHOL 233* 04/04/2014   TRIG 155* 04/04/2014   HDL 60 04/04/2014   LDLCALC 142* 04/04/2014   ALT 23 08/16/2013  AST 21 08/16/2013   NA 144 04/04/2014   K 4.1 04/04/2014   CL 105 04/04/2014   CREATININE 0.87 04/04/2014   BUN 11 04/04/2014   CO2 22 04/04/2014   TSH 2.633 08/16/2013   HGBA1C 5.1 04/04/2014    No results found.  Assessment & Plan:   Nancy Chambers was seen today for dizziness and nausea.  Diagnoses and all orders for this visit:  Other fatigue Orders: -     POCT CBC  Dizziness and giddiness Orders: -     POCT CBC -     CMP14+EGFR -     TSH -     Vitamin B12 -     Sedimentation rate  Depression  Other orders -     DULoxetine (CYMBALTA) 30 MG capsule; Take 1 capsule (30 mg total) by mouth daily. For one week then two daily. Take with a full stomach at  suppertime   I am having [name] start on DULoxetine. I am also having her maintain her multivitamin, Cholecalciferol (VITAMIN D PO), (Green Tea, Camillia sinensis, (GREEN TEA EXTRACT PO)), LECITHIN PO, and cetirizine.  Meds ordered this encounter  Medications  . DULoxetine (CYMBALTA) 30 MG capsule    Sig: Take 1 capsule (30 mg total) by mouth daily. For one week then two daily. Take with a full stomach at suppertime    Dispense:  60 capsule    Refill:  0     Follow-up: Return in about 3 weeks (around 09/30/2014).  Claretta Fraise, M.D.

## 2014-09-10 LAB — CMP14+EGFR
ALT: 16 IU/L (ref 0–32)
AST: 17 IU/L (ref 0–40)
Albumin/Globulin Ratio: 2.2 (ref 1.1–2.5)
Albumin: 4.4 g/dL (ref 3.5–5.5)
Alkaline Phosphatase: 76 IU/L (ref 39–117)
BUN/Creatinine Ratio: 17 (ref 9–23)
BUN: 14 mg/dL (ref 6–24)
Bilirubin Total: 0.3 mg/dL (ref 0.0–1.2)
CHLORIDE: 105 mmol/L (ref 97–108)
CO2: 27 mmol/L (ref 18–29)
CREATININE: 0.83 mg/dL (ref 0.57–1.00)
Calcium: 9.2 mg/dL (ref 8.7–10.2)
GFR calc Af Amer: 91 mL/min/{1.73_m2} (ref >59)
GFR calc non Af Amer: 79 mL/min/{1.73_m2} (ref >59)
Globulin, Total: 2 g/dL (ref 1.5–4.5)
Glucose: 95 mg/dL (ref 65–99)
Potassium: 4.3 mmol/L (ref 3.5–5.2)
SODIUM: 145 mmol/L — AB (ref 134–144)
Total Protein: 6.4 g/dL (ref 6.0–8.5)

## 2014-09-10 LAB — SEDIMENTATION RATE: Sed Rate: 2 mm/hr (ref 0–40)

## 2014-09-10 LAB — VITAMIN B12: Vitamin B-12: 817 pg/mL (ref 211–946)

## 2014-09-10 LAB — TSH: TSH: 3.54 u[IU]/mL (ref 0.450–4.500)

## 2014-09-30 ENCOUNTER — Ambulatory Visit: Payer: 59 | Admitting: Family Medicine

## 2014-10-15 ENCOUNTER — Telehealth: Payer: Self-pay | Admitting: *Deleted

## 2014-10-15 NOTE — Telephone Encounter (Signed)
There are several options that we should discuss. I would like to see her back in the office. It's typical to see people a month after starting on a new medicine like this anyway. I had asked to see her back in about 3 weeks which would've been August 22. Please encourage her to schedule a follow-up and at that time we can look at all of her options.

## 2014-10-16 NOTE — Telephone Encounter (Signed)
Patient aware.

## 2014-10-16 NOTE — Telephone Encounter (Signed)
Continue meds as is. I promise she will not gain significant weight between now and then (9 days)

## 2014-10-16 NOTE — Telephone Encounter (Signed)
The earliest she could schedule was on 9/16 with you. Patient wants to know what she can do until appointment.

## 2014-10-25 ENCOUNTER — Ambulatory Visit: Payer: 59 | Admitting: Family Medicine

## 2015-01-30 ENCOUNTER — Ambulatory Visit (INDEPENDENT_AMBULATORY_CARE_PROVIDER_SITE_OTHER): Payer: 59 | Admitting: Family Medicine

## 2015-01-30 ENCOUNTER — Encounter: Payer: Self-pay | Admitting: Family Medicine

## 2015-01-30 VITALS — BP 109/72 | HR 92 | Temp 97.0°F | Ht 66.0 in | Wt 217.4 lb

## 2015-01-30 DIAGNOSIS — J0100 Acute maxillary sinusitis, unspecified: Secondary | ICD-10-CM

## 2015-01-30 MED ORDER — BUPROPION HCL ER (SR) 150 MG PO TB12: 150.0000 mg | ORAL_TABLET | Freq: Two times a day (BID) | ORAL | Status: DC

## 2015-01-30 MED ORDER — AMOXICILLIN-POT CLAVULANATE 875-125 MG PO TABS: 1.0000 | ORAL_TABLET | Freq: Two times a day (BID) | ORAL | Status: DC

## 2015-01-30 NOTE — Patient Instructions (Addendum)
Great to meet you!  Lets see you back in 3-4 weeks to discuss weight loss  Try to focus on regular exercise (20 minutes 3 times a week) and portion control in the meantime.   Sinusitis, Adult Sinusitis is redness, soreness, and puffiness (inflammation) of the air pockets in the bones of your face (sinuses). The redness, soreness, and puffiness can cause air and mucus to get trapped in your sinuses. This can allow germs to grow and cause an infection.  HOME CARE   Drink enough fluids to keep your pee (urine) clear or pale yellow.  Use a humidifier in your home.  Run a hot shower to create steam in the bathroom. Sit in the bathroom with the door closed. Breathe in the steam 3-4 times a day.  Put a warm, moist washcloth on your face 3-4 times a day, or as told by your doctor.  Use salt water sprays (saline sprays) to wet the thick fluid in your nose. This can help the sinuses drain.  Only take medicine as told by your doctor. GET HELP RIGHT AWAY IF:   Your pain gets worse.  You have very bad headaches.  You are sick to your stomach (nauseous).  You throw up (vomit).  You are very sleepy (drowsy) all the time.  Your face is puffy (swollen).  Your vision changes.  You have a stiff neck.  You have trouble breathing. MAKE SURE YOU:   Understand these instructions.  Will watch your condition.  Will get help right away if you are not doing well or get worse.   This information is not intended to replace advice given to you by your health care provider. Make sure you discuss any questions you have with your health care provider.   Document Released: 07/14/2007 Document Revised: 02/15/2014 Document Reviewed: 08/31/2011 Elsevier Interactive Patient Education [employer]2016 Elsevier Inc.

## 2015-01-30 NOTE — Progress Notes (Signed)
   HPI  Patient presents today here to discuss acute illness.  Patient then she's had 4 weeks of nasal congestion, cough and bilateral ear pain. She take Mucinex with improvement for a couple weeksbut then her cough worsened again. Her last week or so she severe pain itching, and left eye itching. She denies dyspnea. Sh has mild right-sided facial pressure and pain  Requests restarting Wellbutrin tohelp her mood as well as help her lose weight She tried Cymbalta but had increased appetitesince stopped it. Denies suicidal ideation Also wants to discuss diet pills No exercise regimen  PMH: Smoking status noted ROS: Per HPI  Objective: BP 109/72 mmHg  Pulse 92  Temp(Src) 97 F (36.1 C) (Oral)  Ht 5\' 6"  (1.676 m)  Wt 217 lb 6.4 oz (98.612 kg)  BMI 35.11 kg/m2 Gen: NAD, alert, cooperative with exam HEENT: NCAT, TMs normal n right, leftobstructedby cerumen,nares clear, oropharynx clear, right-sided neck slurry sinus pressurepain to palpation CV: RRR, good S1/S2, no murmur Resp: CTABL, no wheezes, non-labored Ext: No edema, warm Neuro: Alert and oriented, No gross deficits  Assessment and plan:  # Acute sinusitis Treating with Augmentin Flonase Return to clinic if worsening or does not improve  # depression, weight gain Restart Wellbutrin Discussed several options with her, she does have slightly increased anxiety with Wellbutrin, consider Trintellix Also consider Topamax  Encouraged exercise and diet control, return to clinic in 3-4 weeks for further discussion    Meds ordered this encounter  Medications  . amoxicillin-clavulanate (AUGMENTIN) 875-125 MG tablet    Sig: Take 1 tablet by mouth 2 (two) times daily.    Dispense:  20 tablet    Refill:  0  . buPROPion (WELLBUTRIN SR) 150 MG 12 hr tablet    Sig: Take 1 tablet (150 mg total) by mouth 2 (two) times daily.    Dispense:  60 tablet    Refill:  2    Murtis SinkSam Bradshaw, MD [facility] 01/30/2015, 4:46 PM

## 2015-01-31 ENCOUNTER — Ambulatory Visit: Payer: 59 | Admitting: Family Medicine

## 2015-03-03 ENCOUNTER — Encounter: Payer: Self-pay | Admitting: Family Medicine

## 2015-03-03 ENCOUNTER — Ambulatory Visit (INDEPENDENT_AMBULATORY_CARE_PROVIDER_SITE_OTHER): Payer: 59 | Admitting: Family Medicine

## 2015-03-03 VITALS — BP 119/80 | HR 71 | Temp 97.0°F | Ht 67.0 in | Wt 219.2 lb

## 2015-03-03 DIAGNOSIS — H1013 Acute atopic conjunctivitis, bilateral: Secondary | ICD-10-CM | POA: Diagnosis not present

## 2015-03-03 DIAGNOSIS — Z0000 Encounter for general adult medical examination without abnormal findings: Secondary | ICD-10-CM

## 2015-03-03 DIAGNOSIS — H1010 Acute atopic conjunctivitis, unspecified eye: Secondary | ICD-10-CM | POA: Insufficient documentation

## 2015-03-03 NOTE — Patient Instructions (Addendum)
Great to meet you!  We will fill out your form when your results are back.   Lets plan to see you in a year unless you need [place] sooner.

## 2015-03-03 NOTE — Progress Notes (Signed)
   HPI  Patient presents today for physical exam and to get some paperwork filled out for work.  Is in good health and denies any concerns besides eye irritation.  She explains that over the last 3 weeks or so she's had left-sided eye irritation. It helps some by Visine. She also has some mild rhinorrhea and she knows that she has mild allergies.   PMH: Smoking status noted ROS: Per HPI  Objective: BP 119/80 mmHg  Pulse 71  Temp(Src) 97 F (36.1 C) (Oral)  Ht _0  (1.702 m)  Wt 219 lb 3.2 oz (99.428 kg)  BMI 34.32 kg/m2 Gen: NAD, alert, cooperative with exam HEENT: NCAT, TM on the right normal, TM on the left obscured by cerumen, left eye and right eye with mild conjunctivitis, normal pupillary reflex CV: RRR, good S1/S2, no murmur Resp: CTABL, no wheezes, non-labored Ext: 1+ pitting edema bilaterally Neuro: Alert and oriented, No gross deficits  Assessment and plan:  # Physical exam, healthcare maintenance Reviewed usual Pap smear and mammogram recommendations, she follows up with gynecology routinely for this. Discussed colonoscopy, tan-white she's had one in 2009. Hepatitis C added today Basic labs per her company insurance request   Orders Placed This Encounter  Procedures  . Hepatitis C Antibody  . CBC  . Lipid Panel  . [place], MD [facility] 03/03/2015, 3:18 PM

## 2015-03-04 LAB — LIPID PANEL
Chol/HDL Ratio: 3.8 ratio units (ref 0.0–4.4)
Cholesterol, Total: 246 mg/dL — ABNORMAL HIGH (ref 100–199)
HDL: 64 mg/dL (ref >39)
LDL Calculated: 160 mg/dL — ABNORMAL HIGH (ref 0–99)
Triglycerides: 110 mg/dL (ref 0–149)
VLDL Cholesterol Cal: 22 mg/dL (ref 5–40)

## 2015-03-04 LAB — CBC
Hematocrit: 39.8 % (ref 34.0–46.6)
Hemoglobin: 13 g/dL (ref 11.1–15.9)
MCH: 29.1 pg (ref 26.6–33.0)
MCHC: 32.7 g/dL (ref 31.5–35.7)
MCV: 89 fL (ref 79–97)
PLATELETS: 251 10*3/uL (ref 150–379)
RBC: 4.46 x10E6/uL (ref 3.77–5.28)
RDW: 14.1 % (ref 12.3–15.4)
WBC: 5.9 10*3/uL (ref 3.4–10.8)

## 2015-03-04 LAB — CMP14+EGFR
ALBUMIN: 4.5 g/dL (ref 3.5–5.5)
ALK PHOS: 77 IU/L (ref 39–117)
ALT: 18 IU/L (ref 0–32)
AST: 16 IU/L (ref 0–40)
Albumin/Globulin Ratio: 1.9 (ref 1.1–2.5)
BILIRUBIN TOTAL: 0.5 mg/dL (ref 0.0–1.2)
BUN / CREAT RATIO: 11 (ref 9–23)
BUN: 9 mg/dL (ref 6–24)
CHLORIDE: 102 mmol/L (ref 96–106)
CO2: 26 mmol/L (ref 18–29)
Calcium: 9.1 mg/dL (ref 8.7–10.2)
Creatinine, Ser: 0.79 mg/dL (ref 0.57–1.00)
GFR calc Af Amer: 96 mL/min/{1.73_m2} (ref >59)
GFR calc non Af Amer: 83 mL/min/{1.73_m2} (ref >59)
GLOBULIN, TOTAL: 2.4 g/dL (ref 1.5–4.5)
GLUCOSE: 85 mg/dL (ref 65–99)
Potassium: 4.2 mmol/L (ref 3.5–5.2)
SODIUM: 144 mmol/L (ref 134–144)
Total Protein: 6.9 g/dL (ref 6.0–8.5)

## 2015-03-04 LAB — HEPATITIS C ANTIBODY: Hep C Virus Ab: <0.1 s/co ratio (ref 0.0–0.9)

## 2015-03-15 ENCOUNTER — Telehealth: Payer: Self-pay | Admitting: Family Medicine

## 2015-03-15 NOTE — Telephone Encounter (Signed)
Please review and advise.

## 2015-03-17 NOTE — Telephone Encounter (Signed)
We should probably see her prior to starting another antidepressant.   Will ask nursing to discuss.   Murtis Sink, MD [facility] 03/17/2015, 1:17 PM

## 2015-03-26 NOTE — Telephone Encounter (Signed)
Patient aware and will call back to schedule appointment.

## 2015-04-19 ENCOUNTER — Telehealth: Payer: Self-pay | Admitting: Family Medicine

## 2015-04-19 NOTE — Telephone Encounter (Signed)
Spoke with patient and advised her that we are out of appointments today and we do not have xray on Saturday. Patient advised that she can go to Urgent Care if needed and I also scheduled her an appointment for Monday afternoon also.

## 2015-04-21 ENCOUNTER — Ambulatory Visit (INDEPENDENT_AMBULATORY_CARE_PROVIDER_SITE_OTHER): Payer: 59 | Admitting: Family

## 2015-04-21 ENCOUNTER — Encounter: Payer: Self-pay | Admitting: Family

## 2015-04-21 VITALS — BP 111/74 | HR 84 | Temp 96.9°F | Ht 67.0 in | Wt 223.0 lb

## 2015-04-21 DIAGNOSIS — N3001 Acute cystitis with hematuria: Secondary | ICD-10-CM | POA: Diagnosis not present

## 2015-04-21 DIAGNOSIS — M545 Low back pain: Secondary | ICD-10-CM

## 2015-04-21 LAB — URINALYSIS, COMPLETE
BILIRUBIN UA: NEGATIVE
GLUCOSE, UA: NEGATIVE
KETONES UA: NEGATIVE
NITRITE UA: NEGATIVE
PROTEIN UA: NEGATIVE
SPEC GRAV UA: 1.02 (ref 1.005–1.030)
Urobilinogen, Ur: 0.2 mg/dL (ref 0.2–1.0)
pH, UA: 5.5 (ref 5.0–7.5)

## 2015-04-21 LAB — MICROSCOPIC EXAMINATION: RBC, UA: NONE SEEN /hpf (ref 0–?)

## 2015-04-21 MED ORDER — SULFAMETHOXAZOLE-TRIMETHOPRIM 800-160 MG PO TABS: 1.0000 | ORAL_TABLET | Freq: Two times a day (BID) | ORAL | Status: DC

## 2015-04-21 NOTE — Progress Notes (Signed)
   Subjective:    Patient ID: Nancy HarriesRobin C Middlekauff, female    DOB: 02/10/1955, 60 y.o.   MRN: [id number]  Back Pain This is a new problem. The current episode started in the past 7 days. The problem occurs constantly. The problem has been waxing and waning since onset. The pain is present in the lumbar spine. The quality of the pain is described as aching. Radiates to: left lower abdomen. The pain is at a severity of 8/10. The pain is mild. Pertinent negatives include no bladder incontinence, bowel incontinence, dysuria, headaches, pelvic pain, tingling, weakness or weight loss. (Frequency ) She has tried heat and NSAIDs for the symptoms. The treatment provided mild relief.      Review of Systems  Constitutional: Negative.  Negative for weight loss.  HENT: Negative.   Eyes: Negative.   Respiratory: Negative.  Negative for shortness of breath.   Cardiovascular: Negative.  Negative for palpitations.  Gastrointestinal: Negative.  Negative for bowel incontinence.  Endocrine: Negative.   Genitourinary: Negative.  Negative for bladder incontinence, dysuria and pelvic pain.  Musculoskeletal: Positive for back pain.  Neurological: Negative.  Negative for tingling, weakness and headaches.  Hematological: Negative.   Psychiatric/Behavioral: Negative.   All other systems reviewed and are negative.      Objective:   Physical Exam  Constitutional: She is oriented to person, place, and time. She appears well-developed and well-nourished. No distress.  HENT:  Head: Normocephalic.  Eyes: Pupils are equal, round, and reactive to light.  Neck: Normal range of motion. Neck supple. No thyromegaly present.  Cardiovascular: Normal rate, regular rhythm, normal heart sounds and intact distal pulses.   No murmur heard. Pulmonary/Chest: Effort normal and breath sounds normal. No respiratory distress. She has no wheezes.  Abdominal: Soft. Bowel sounds are normal. She exhibits no distension. There is no tenderness.    Musculoskeletal: She exhibits no edema or tenderness.  Negative for CVA tenderness   Neurological: She is alert and oriented to person, place, and time. She has normal reflexes. No cranial nerve deficit.  Skin: Skin is warm and dry.  Psychiatric: She has a normal mood and affect. Her behavior is normal. Judgment and thought content normal.  Vitals reviewed.     BP 111/74 mmHg  Pulse 84  Temp(Src) 96.9 F (36.1 C) (Oral)  Ht 5\' 7"  (1.702 m)  Wt 223 lb (101.152 kg)  BMI 34.92 kg/m2     Assessment & Plan:  1. Low back pain without sciatica, unspecified back pain laterality - Urinalysis, Complete  2. Acute cystitis with hematuria -Force fluids AZO over the counter X2 days RTO prn Culture pending - sulfamethoxazole-trimethoprim (BACTRIM DS) 800-160 MG tablet; Take 1 tablet by mouth 2 (two) times daily.  Dispense: 14 tablet; Refill: 0  Jannifer Rodneyhristy Eugune Sine, FNP

## 2015-04-21 NOTE — Patient Instructions (Signed)

## 2015-04-23 LAB — URINE CULTURE

## 2015-04-28 ENCOUNTER — Encounter: Payer: Self-pay | Admitting: Family Medicine

## 2015-04-28 ENCOUNTER — Ambulatory Visit (INDEPENDENT_AMBULATORY_CARE_PROVIDER_SITE_OTHER): Payer: 59 | Admitting: Family Medicine

## 2015-04-28 VITALS — BP 111/71 | HR 76 | Temp 97.1°F | Ht 67.0 in | Wt 224.2 lb

## 2015-04-28 DIAGNOSIS — M533 Sacrococcygeal disorders, not elsewhere classified: Secondary | ICD-10-CM | POA: Diagnosis not present

## 2015-04-28 DIAGNOSIS — R109 Unspecified abdominal pain: Secondary | ICD-10-CM

## 2015-04-28 DIAGNOSIS — R1032 Left lower quadrant pain: Secondary | ICD-10-CM

## 2015-04-28 LAB — URINALYSIS, ROUTINE W REFLEX MICROSCOPIC
BILIRUBIN UA: NEGATIVE
Glucose, UA: NEGATIVE
Ketones, UA: NEGATIVE
Nitrite, UA: NEGATIVE
PH UA: 5.5 (ref 5.0–7.5)
Protein, UA: NEGATIVE
RBC UA: NEGATIVE
Specific Gravity, UA: 1.015 (ref 1.005–1.030)
Urobilinogen, Ur: 0.2 mg/dL (ref 0.2–1.0)

## 2015-04-28 LAB — MICROSCOPIC EXAMINATION
BACTERIA UA: NONE SEEN
Epithelial Cells (non renal): NONE SEEN /hpf (ref 0–10)
RBC, UA: NONE SEEN /hpf (ref 0–?)

## 2015-04-28 MED ORDER — KETOROLAC TROMETHAMINE 60 MG/2ML IM SOLN
60.0000 mg | Freq: Once | INTRAMUSCULAR | Status: AC
  Administered 2015-04-28: 60 mg via INTRAMUSCULAR

## 2015-04-28 MED ORDER — DICLOFENAC SODIUM 75 MG PO TBEC: 75.0000 mg | DELAYED_RELEASE_TABLET | Freq: Two times a day (BID) | ORAL | Status: DC

## 2015-04-28 NOTE — Progress Notes (Signed)
   HPI  Patient presents today here for left-sided low back pain and lower quadrant abdominal pain.  Patient's mind is been going on for 2-3 weeks. She initially developed left-sided low back pain described as dull with sharp episodes. She also has radiation down to her left groin and left lower quadrant. She was treated for UTI with mild transient improvement and return of symptoms today. No injuries There have been no fevers hematuria, foul-smelling urine, or dysuria.  She's tolerating foods and fluids normally She has no history of renal stones  PMH: Smoking status noted ROS: Per HPI  Objective: BP 111/71 mmHg  Pulse 76  Temp(Src) 97.1 F (36.2 C) (Oral)  Ht 5\' 7"  (1.702 m)  Wt 224 lb 3.2 oz (101.696 kg)  BMI 35.11 kg/m2 Gen: NAD, alert, cooperative with exam HEENT: NCAT CV: RRR, good S1/S2, no murmur Resp: CTABL, no wheezes, non-labored Abd: Soft, tenderness to palpation of LLQ,  BS present, no guarding or organomegaly Ext: No edema, warm Neuro: Alert and oriented, No gross deficits MSK: Tenderness to palpation of left-sided SI joint. 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 Urine culture    Order Specific Question:  Source    Answer:  urine  . Urinalysis, Routine w reflex microscopic (not at [facility])    Meds ordered this encounter  Medications  . diclofenac (VOLTAREN) 75 MG EC tablet    Sig: Take 1 tablet (75 mg total) by mouth 2 (two) times daily.    Dispense:  30 tablet   Refill:  0  . ketorolac (TORADOL) injection 60 mg    Sig:     Murtis SinkSam Meisha Salone, MD [facility] 04/28/2015, 4:33 PM

## 2015-04-28 NOTE — Patient Instructions (Addendum)
Great to see yoU!  It looks like SI joint dysfunction I am sending your urine for a clulture to make sure there is no infection  Try ice 15 minutes, three times a day  Try diclofenac twice daily for 5 days then as needed

## 2015-04-29 LAB — URINE CULTURE

## 2015-05-20 LAB — HM MAMMOGRAPHY

## 2015-05-29 ENCOUNTER — Ambulatory Visit: Payer: 59 | Admitting: Family Medicine

## 2015-06-03 ENCOUNTER — Ambulatory Visit: Payer: 59 | Admitting: Family Medicine

## 2015-06-04 ENCOUNTER — Encounter: Payer: Self-pay | Admitting: *Deleted

## 2015-07-08 ENCOUNTER — Encounter: Payer: Self-pay | Admitting: Family Medicine

## 2015-07-08 ENCOUNTER — Ambulatory Visit (INDEPENDENT_AMBULATORY_CARE_PROVIDER_SITE_OTHER): Payer: 59 | Admitting: Family Medicine

## 2015-07-08 VITALS — BP 102/64 | HR 86 | Temp 96.8°F | Ht 67.0 in | Wt 220.4 lb

## 2015-07-08 DIAGNOSIS — H8110 Benign paroxysmal vertigo, unspecified ear: Secondary | ICD-10-CM

## 2015-07-08 MED ORDER — MECLIZINE HCL 25 MG PO TABS: 25.0000 mg | ORAL_TABLET | Freq: Three times a day (TID) | ORAL | Status: DC | PRN

## 2015-07-08 NOTE — Progress Notes (Signed)
   HPI  Patient presents today dizziness evaluation.  Patient said 2 weeks of symptoms, it all started after she got a new pair of glasses, however persisted after she continued wearing her old glasses. She describes some room spinning sensation when lying down, also worsening of the symptoms whenever she bends over. She seems to have symptoms persistently on looking to the right and staggering to the right.  No unilateral weakness, numbness, or tingling.  Denies any dysarthria.  No ear pain. However sure her ears do feel full.  PMH: Smoking status noted ROS: Per HPI  Objective: BP 102/64 mmHg  Pulse 86  Temp(Src) 96.8 F (36 C) (Oral)  Ht 5\' 7"  (1.702 m)  Wt 220 lb 6.4 oz (99.973 kg)  BMI 34.51 kg/m2 Gen: NAD, alert, cooperative with exam HEENT: NCAT, TMs normal bilaterally CV: RRR, good S1/S2, no murmur Resp: CTABL, no wheezes, non-labored Ext: No edema, warm Neuro: Alert and oriented, cranial nerves II through XII intact, strength 5/5 and sensation intact in bilateral upper and lower extremities Normal gait, Dix-Hallpike is negative  Assessment and plan:  # Dizziness, benign positional vertigo Neurologic exam is reassuring, her symptoms are consistent with vertigo Discussed Epley's maneuvers, given handout Meclizine trial Follow-up as needed   Meds ordered this encounter  Medications  . meclizine (ANTIVERT) 25 MG tablet    Sig: Take 1 tablet (25 mg total) by mouth 3 (three) times daily as needed for dizziness.    Dispense:  30 tablet    Refill:  0    Murtis SinkSam Bradshaw, MD [facility] 07/08/2015, 4:13 PM

## 2015-07-08 NOTE — Patient Instructions (Signed)
Great to see you!  It sounds like vertigo, try the exercises I gave you  Benign Positional Vertigo Vertigo is the feeling that you or your surroundings are moving when they are not. Benign positional vertigo is the most common form of vertigo. The cause of this condition is not serious (is benign). This condition is triggered by certain movements and positions (is positional). This condition can be dangerous if it occurs while you are doing something that could endanger you or others, such as driving.  CAUSES In many cases, the cause of this condition is not known. It may be caused by a disturbance in an area of the inner ear that helps your brain to sense movement and balance. This disturbance can be caused by a viral infection (labyrinthitis), head injury, or repetitive motion. RISK FACTORS This condition is more likely to develop in:  Women.  People who are 42 years of age or older. SYMPTOMS Symptoms of this condition usually happen when you move your head or your eyes in different directions. Symptoms may start suddenly, and they usually last for less than a minute. Symptoms may include:  Loss of balance and falling.  Feeling like you are spinning or moving.  Feeling like your surroundings are spinning or moving.  Nausea and vomiting.  Blurred vision.  Dizziness.  Involuntary eye movement (nystagmus). Symptoms can be mild and cause only slight annoyance, or they can be severe and interfere with daily life. Episodes of benign positional vertigo may return (recur) over time, and they may be triggered by certain movements. Symptoms may improve over time. DIAGNOSIS This condition is usually diagnosed by medical history and a physical exam of the head, neck, and ears. You may be referred to a health care provider who specializes in ear, nose, and throat (ENT) problems (otolaryngologist) or a provider who specializes in disorders of the nervous system (neurologist). You may have  additional testing, including:  MRI.  A CT scan.  Eye movement tests. Your health care provider may ask you to change positions quickly while he or she watches you for symptoms of benign positional vertigo, such as nystagmus. Eye movement may be tested with an electronystagmogram (ENG), caloric stimulation, the Dix-Hallpike test, or the roll test.  An electroencephalogram (EEG). This records electrical activity in your brain.  Hearing tests. TREATMENT Usually, your health care provider will treat this by moving your head in specific positions to adjust your inner ear back to normal. Surgery may be needed in severe cases, but this is rare. In some cases, benign positional vertigo may resolve on its own in 2-4 weeks. HOME CARE INSTRUCTIONS Safety  Move slowly.Avoid sudden body or head movements.  Avoid driving.  Avoid operating heavy machinery.  Avoid doing any tasks that would be dangerous to you or others if a vertigo episode would occur.  If you have trouble walking or keeping your balance, try using a cane for stability. If you feel dizzy or unstable, sit down right away.  Return to your normal activities as told by your health care provider. Ask your health care provider what activities are safe for you. General Instructions  Take over-the-counter and prescription medicines only as told by your health care provider.  Avoid certain positions or movements as told by your health care provider.  Drink enough fluid to keep your urine clear or pale yellow.  Keep all follow-up visits as told by your health care provider. This is important. SEEK MEDICAL CARE IF:  You have a  fever.  Your condition gets worse or you develop new symptoms.  Your family or friends notice any behavioral changes.  Your nausea or vomiting gets worse.  You have numbness or a "pins and needles" sensation. SEEK IMMEDIATE MEDICAL CARE IF:  You have difficulty speaking or moving.  You are always  dizzy.  You faint.  You develop severe headaches.  You have weakness in your legs or arms.  You have changes in your hearing or vision.  You develop a stiff neck.  You develop sensitivity to light.   This information is not intended to replace advice given to you by your health care provider. Make sure you discuss any questions you have with your health care provider.   Document Released: 11/02/2005 Document Revised: 10/16/2014 Document Reviewed: 05/20/2014 Elsevier Interactive Patient Education [employer]2016 [employer] Inc.

## 2015-07-11 ENCOUNTER — Emergency Department (HOSPITAL_COMMUNITY): Payer: 59

## 2015-07-11 ENCOUNTER — Encounter (HOSPITAL_COMMUNITY): Payer: Self-pay | Admitting: Emergency Medicine

## 2015-07-11 ENCOUNTER — Emergency Department (HOSPITAL_COMMUNITY)
Admission: EM | Admit: 2015-07-11 | Discharge: 2015-07-11 | Disposition: A | Discharge status: Home / Self Care | Payer: 59 | Attending: Emergency Medicine | Admitting: Emergency Medicine

## 2015-07-11 DIAGNOSIS — R55 Syncope and collapse: Secondary | ICD-10-CM | POA: Diagnosis present

## 2015-07-11 DIAGNOSIS — Z79899 Other long term (current) drug therapy: Secondary | ICD-10-CM | POA: Diagnosis not present

## 2015-07-11 DIAGNOSIS — R42 Dizziness and giddiness: Secondary | ICD-10-CM | POA: Diagnosis not present

## 2015-07-11 DIAGNOSIS — Z87891 Personal history of nicotine dependence: Secondary | ICD-10-CM | POA: Insufficient documentation

## 2015-07-11 DIAGNOSIS — R531 Weakness: Secondary | ICD-10-CM

## 2015-07-11 LAB — URINALYSIS, ROUTINE W REFLEX MICROSCOPIC
Bilirubin Urine: NEGATIVE
GLUCOSE, UA: NEGATIVE mg/dL
Hgb urine dipstick: NEGATIVE
KETONES UR: NEGATIVE mg/dL
LEUKOCYTES UA: NEGATIVE
NITRITE: NEGATIVE
PROTEIN: NEGATIVE mg/dL
Specific Gravity, Urine: 1.013 (ref 1.005–1.030)
pH: 6 (ref 5.0–8.0)

## 2015-07-11 LAB — CBC
HCT: 40.4 % (ref 36.0–46.0)
Hemoglobin: 12.5 g/dL (ref 12.0–15.0)
MCH: 28.6 pg (ref 26.0–34.0)
MCHC: 30.9 g/dL (ref 30.0–36.0)
MCV: 92.4 fL (ref 78.0–100.0)
PLATELETS: 216 10*3/uL (ref 150–400)
RBC: 4.37 MIL/uL (ref 3.87–5.11)
RDW: 14 % (ref 11.5–15.5)
WBC: 4.8 10*3/uL (ref 4.0–10.5)

## 2015-07-11 LAB — BASIC METABOLIC PANEL
Anion gap: 6 (ref 5–15)
BUN: 21 mg/dL — AB (ref 6–20)
CALCIUM: 9 mg/dL (ref 8.9–10.3)
CHLORIDE: 107 mmol/L (ref 101–111)
CO2: 25 mmol/L (ref 22–32)
CREATININE: 0.85 mg/dL (ref 0.44–1.00)
Glucose, Bld: 94 mg/dL (ref 65–99)
Potassium: 3.7 mmol/L (ref 3.5–5.1)
SODIUM: 138 mmol/L (ref 135–145)

## 2015-07-11 LAB — COMPREHENSIVE METABOLIC PANEL
ALBUMIN: 3.9 g/dL (ref 3.5–5.0)
ALT: 17 U/L (ref 14–54)
ANION GAP: 6 (ref 5–15)
AST: 18 U/L (ref 15–41)
Alkaline Phosphatase: 59 U/L (ref 38–126)
BILIRUBIN TOTAL: 0.7 mg/dL (ref 0.3–1.2)
BUN: 21 mg/dL — ABNORMAL HIGH (ref 6–20)
CHLORIDE: 108 mmol/L (ref 101–111)
CO2: 24 mmol/L (ref 22–32)
Calcium: 9 mg/dL (ref 8.9–10.3)
Creatinine, Ser: 0.81 mg/dL (ref 0.44–1.00)
GFR calc Af Amer: >60 mL/min (ref >60)
GFR calc non Af Amer: >60 mL/min (ref >60)
GLUCOSE: 100 mg/dL — AB (ref 65–99)
POTASSIUM: 4 mmol/L (ref 3.5–5.1)
SODIUM: 138 mmol/L (ref 135–145)
TOTAL PROTEIN: 5.9 g/dL — AB (ref 6.5–8.1)

## 2015-07-11 LAB — I-STAT TROPONIN, ED: TROPONIN I, POC: 0 ng/mL (ref 0.00–0.08)

## 2015-07-11 LAB — CBG MONITORING, ED: GLUCOSE-CAPILLARY: 116 mg/dL — AB (ref 65–99)

## 2015-07-11 IMAGING — CR DG CHEST 2V
2 series · 2 of 2 positions shown · non-contrast
Comparison: [DATE]

CLINICAL DATA: Near syncopal event this morning. Asymptomatic
presently.

EXAM:
CHEST  2 VIEW

[chest lat]
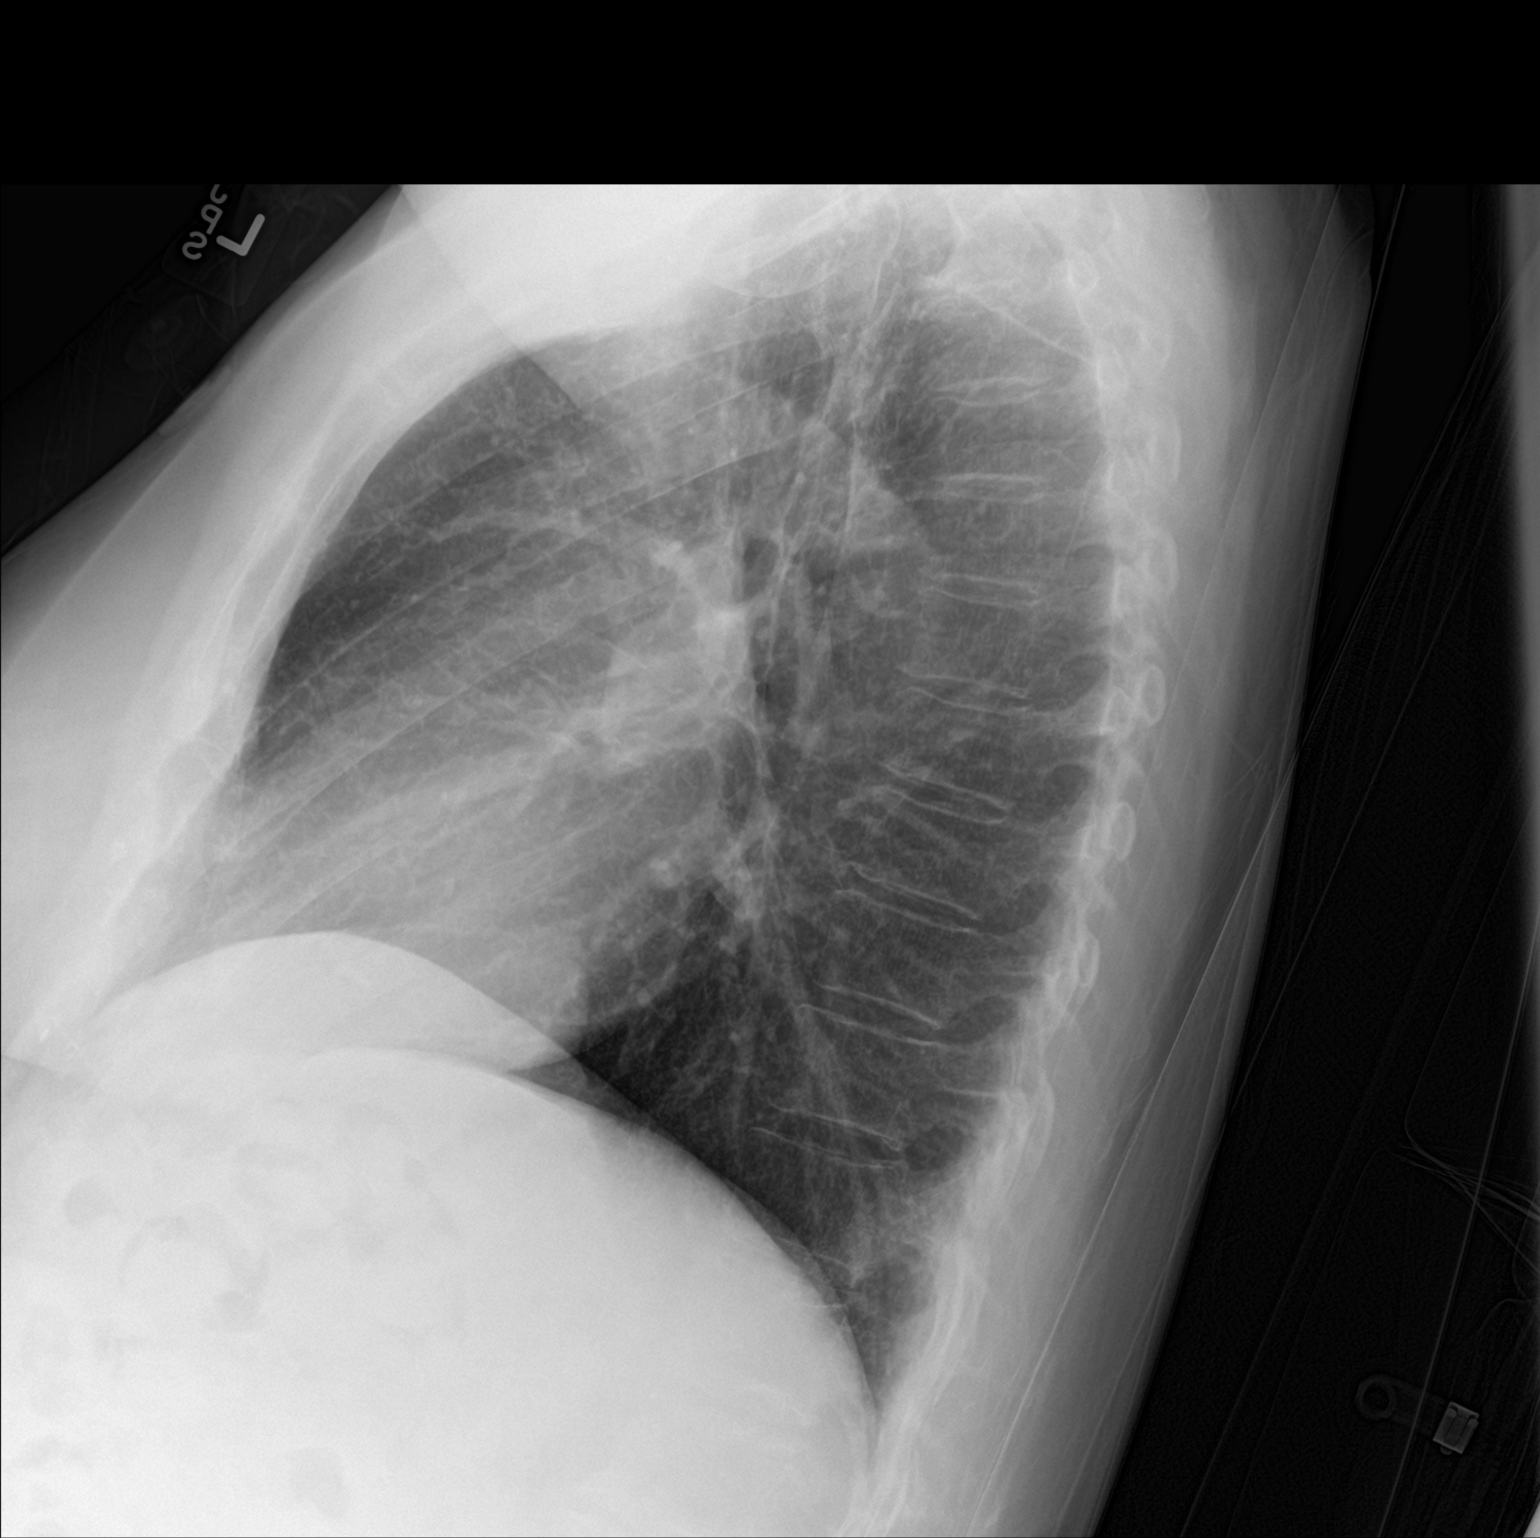

[chest ap]
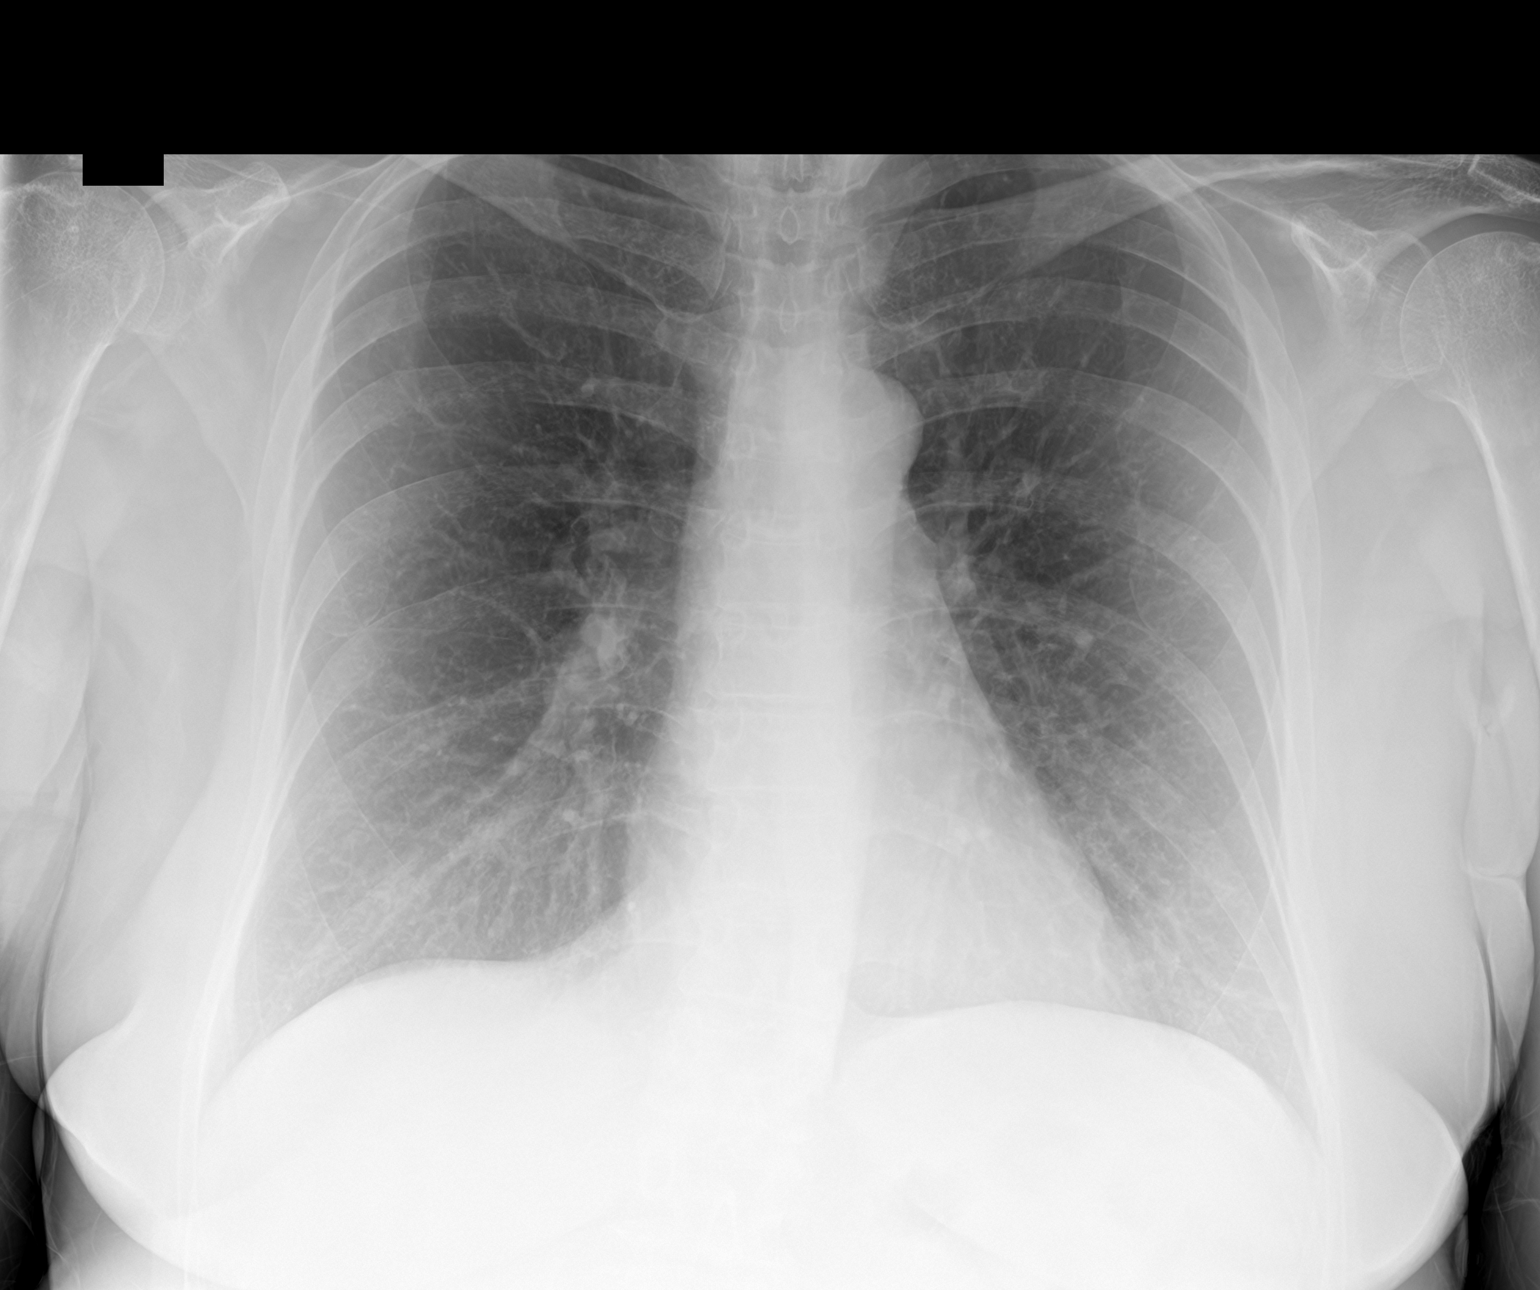

[2 of 2 positions shown; findings below may reference images not displayed]

FINDINGS: Heart size is normal. Mediastinal shadows are normal. The lungs are
clear. No bronchial thickening. No infiltrate, mass, effusion or
collapse. Pulmonary vascularity is normal. No bony abnormality.
IMPRESSION: Normal chest

## 2015-07-11 IMAGING — CT CT HEAD W/O CM
3 of 4 series · 17 of 47 positions shown, 20 images · non-contrast
Comparison: None.

CLINICAL DATA: Dizziness for 2 weeks while walking

EXAM:
CT HEAD WITHOUT CONTRAST
TECHNIQUE: Contiguous axial images were obtained from the base of the skull
through the vertex without intravenous contrast.

[Series 201: head w/o, idose (1) · axial · non-contrast · 0.49mm/px · z∈[+88,+213]mm · 11 of 31 slices shown, 14 images]
[im 3/31  brain]
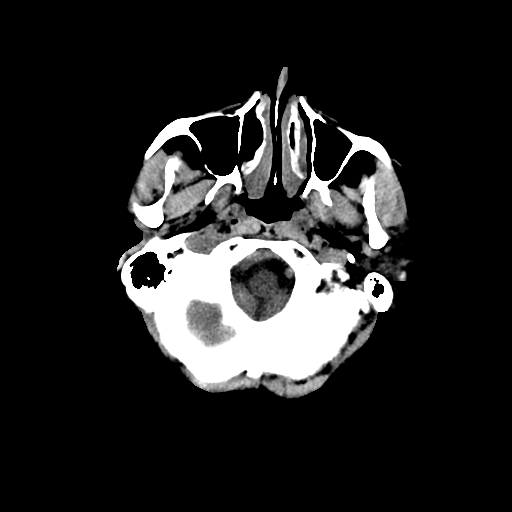
[im 3/31  bone]
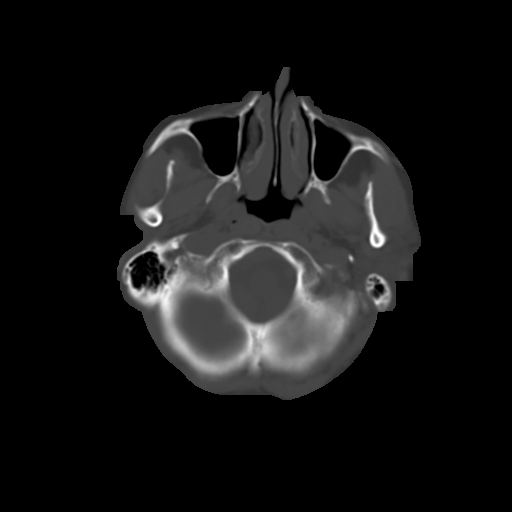
[im 5/31  brain]
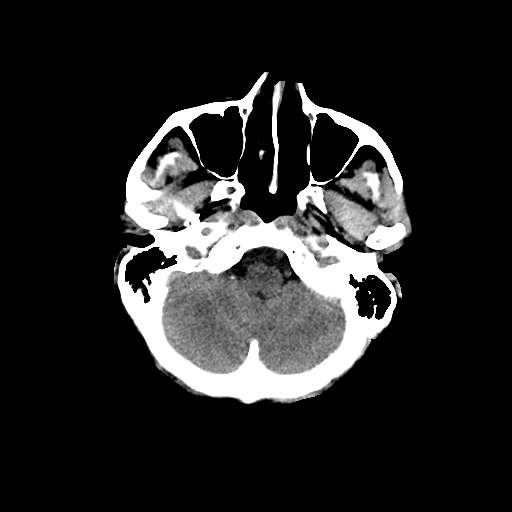
[im 7/31  brain]
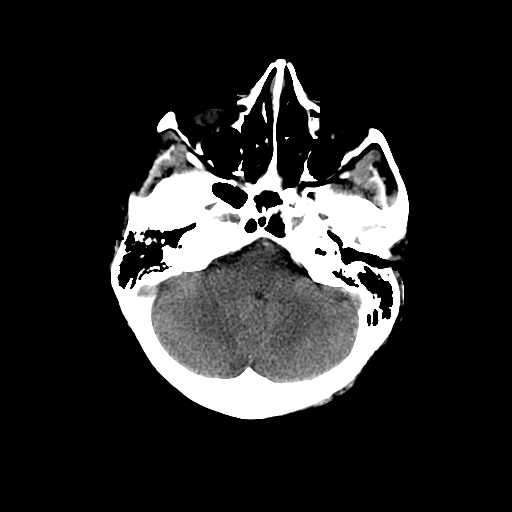
[im 11/31  brain]
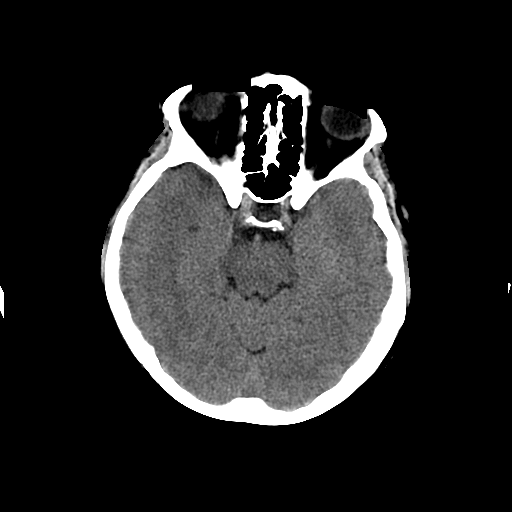
[im 13/31  brain]
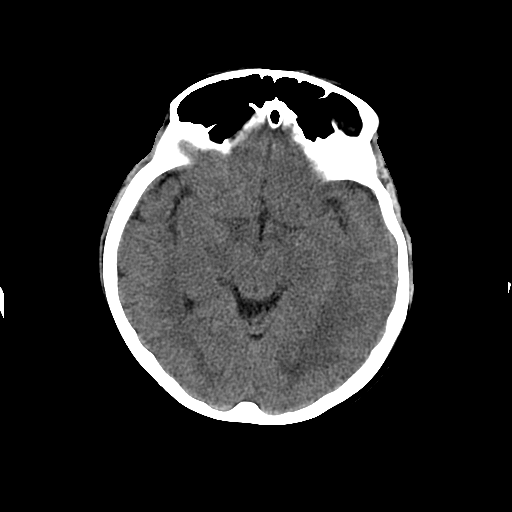
[im 13/31  bone]
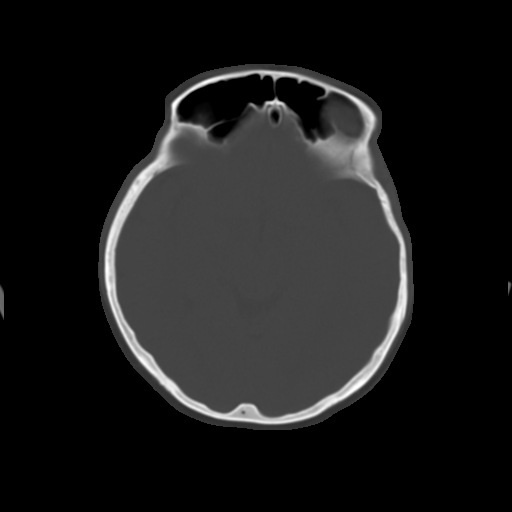
[im 16/31  brain]
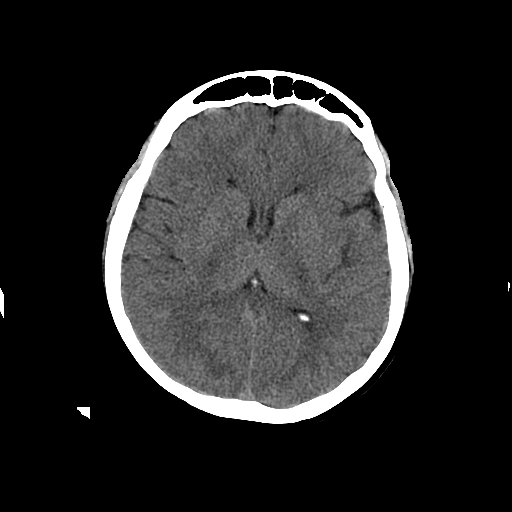
[im 18/31  brain]
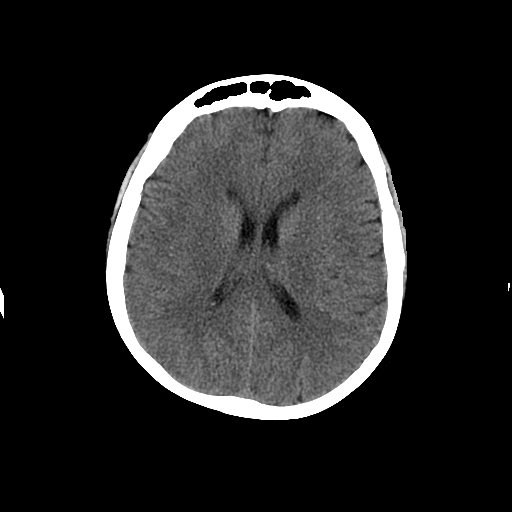
[im 20/31  brain]
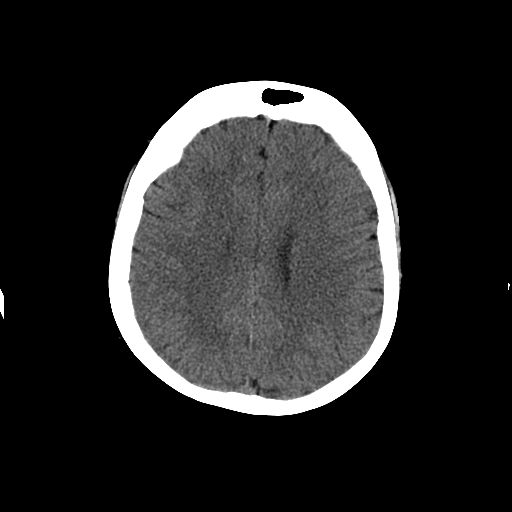
[im 24/31  brain]
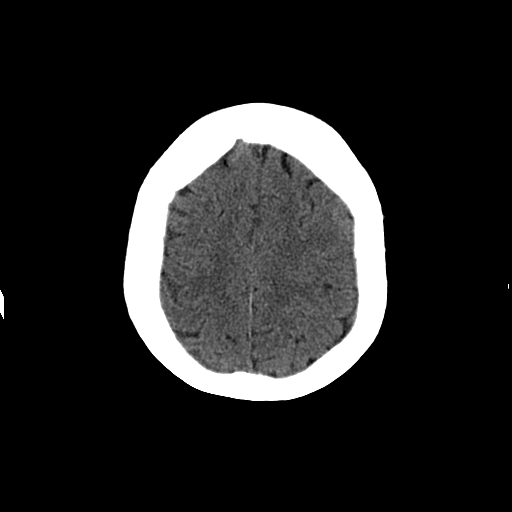
[im 24/31  bone]
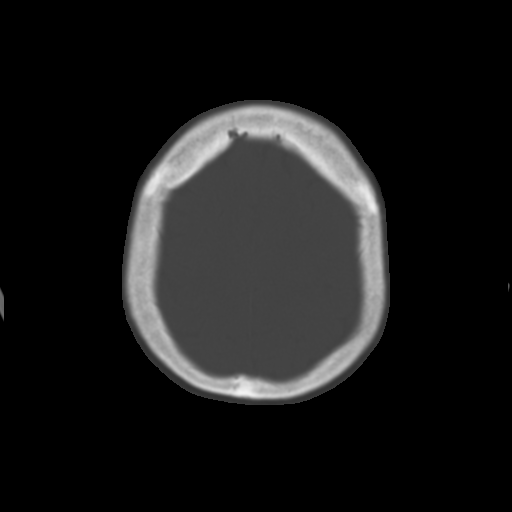
[im 26/31  brain]
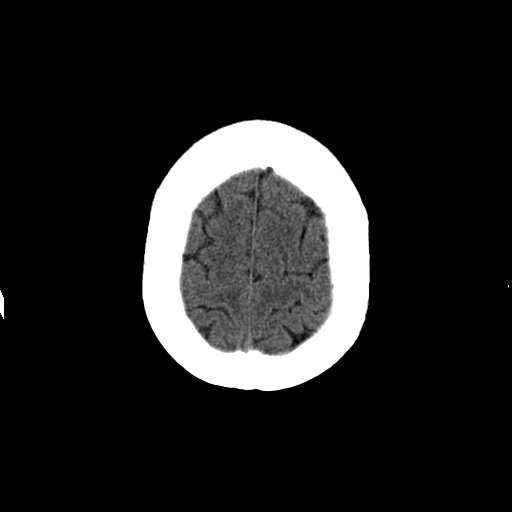
[im 28/31  brain]
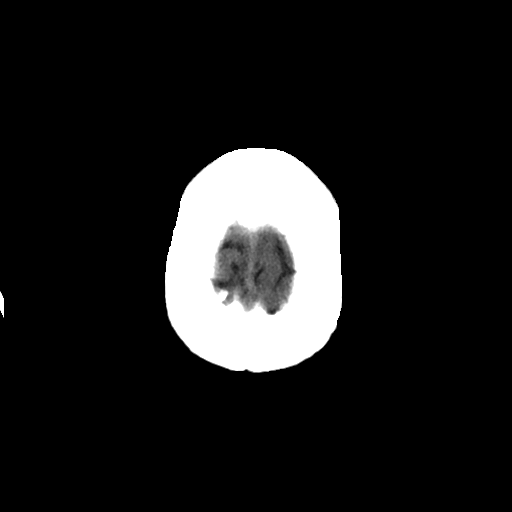

[Series 205: coronal st, idose (1) · coronal · 0.40mm/px · 3 of 56 slices shown]
[im 19/56  brain]
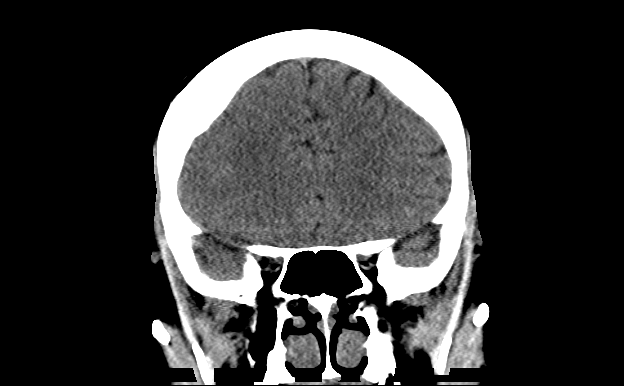
[im 25/56  brain]
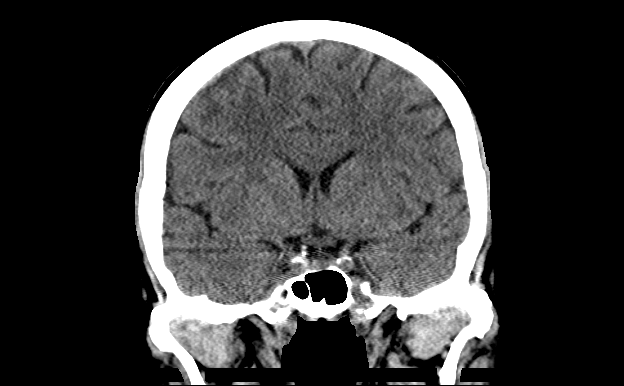
[im 31/56  brain]
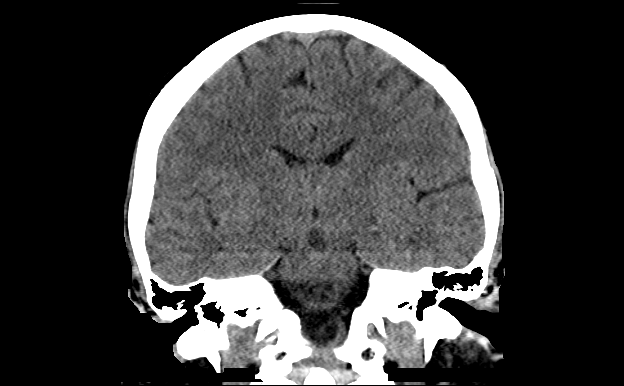

[Series 206: sagittal st, idose (1) · sagittal · 0.40mm/px · 3 of 50 slices shown]
[im 17/50  brain]
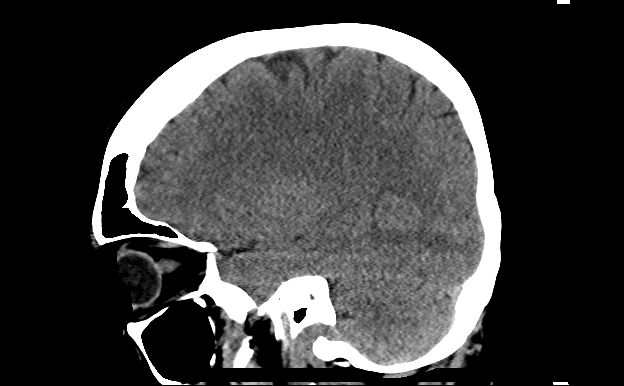
[im 25/50  brain]
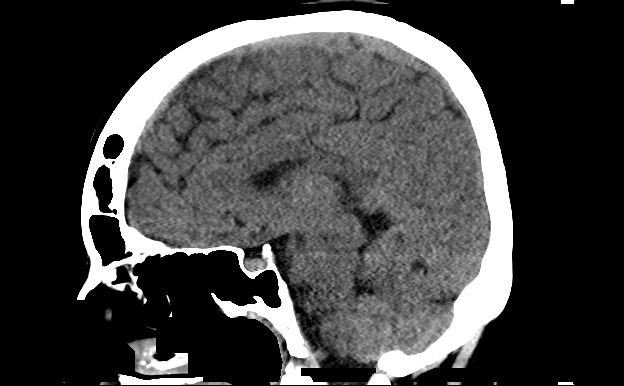
[im 33/50  brain]
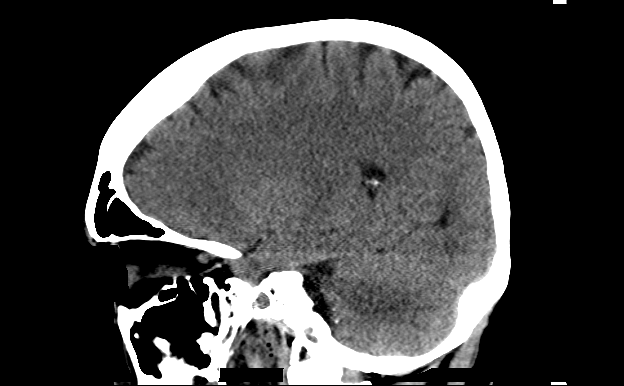

[17 of 47 positions shown; findings below may reference images not displayed]

FINDINGS: Brain parenchyma, ventricular system, and extra-axial space are
within normal limits. No mass effect, midline shift, or acute
hemorrhage. Cranium is intact.
IMPRESSION: No acute intracranial pathology.

## 2015-07-11 MED ORDER — SODIUM CHLORIDE 0.9 % IV BOLUS (SEPSIS)
500.0000 mL | Freq: Once | INTRAVENOUS | Status: AC
  Administered 2015-07-11: 500 mL via INTRAVENOUS

## 2015-07-11 NOTE — ED Notes (Signed)
Pt states she is bee feeling weak for the past few weeks, she had been seen by her PCP and was diagnose with vertigo, pt woke up today and when getting a shower she started getting very diaphoretic and almost pass out. Denies any nausea or vomiting.

## 2015-07-11 NOTE — ED Provider Notes (Signed)
CSN: [id number]     Arrival date & time 07/11/15  0601 History   First MD Initiated Contact with Patient 07/11/15 [phone]     Chief Complaint  Patient presents with  . Near Syncope     (Consider location/radiation/quality/duration/timing/severity/associated sxs/prior Treatment) HPI  This is a 60 year old female who presents today complaining of feeling very weak today. She states that her symptoms began 2 weeks ago with listing to the right when she walked. She states this began she had a new glasses prescription. She was seen by her primary care physician and diagnosed with vertigo. She started on Antivert. She took one Antivert and gave her headache. She has not taken any since. She states that the past she has had vertigo and the Antivert worked. She has continued to feel like she is listing to her right when she walks. She also feels like her right leg is shorter than her left leg. She has reverted to her old glasses. She has continued to have the symptoms. Today she was in the shower when she felt very diaphoretic and almost passed out. She denies any new headache today., Neck pain, chest pain, abdominal pain, nausea, vomiting, or diarrhea. She has no signs or history of DVT or PE. She is feeling comfortable sitting on the bed.   Past Medical History  Diagnosis Date  . Allergy   . Depression   . Anxiety    Past Surgical History  Procedure Laterality Date  . Dilation and curettage of uterus    . Varicose vein surgery    . Broken nose surg     Family History  Problem Relation Age of Onset  . Pancreatic cancer Mother   . Liver cancer Mother   . Stroke Mother   . Hypertension Mother   . Heart attack Mother   . Heart attack Father   . Lung cancer Sister    Social History  Substance Use Topics  . Smoking status: Former Games developer  . Smokeless tobacco: Never Used  . Alcohol Use: No   OB History    Gravida Para Term Preterm AB TAB SAB Ectopic Multiple Living   Review of Systems  All other systems reviewed and are negative.     Allergies  Prozac  Home Medications   Prior to Admission medications   Medication Sig Start Date End Date Taking? Authorizing Provider  Cholecalciferol (VITAMIN D PO) Take by mouth.    Historical Provider, MD  diclofenac (VOLTAREN) 75 MG EC tablet Take 1 tablet (75 mg total) by mouth 2 (two) times daily. 04/28/15   Elenora Gamma, MD  Green Tea, Camillia sinensis, (GREEN TEA EXTRACT PO) Take by mouth.    Historical Provider, MD  LECITHIN PO Take by mouth.    Historical Provider, MD  meclizine (ANTIVERT) 25 MG tablet Take 1 tablet (25 mg total) by mouth 3 (three) times daily as needed for dizziness. 07/08/15   Elenora Gamma, MD  Multiple Vitamin (MULTIVITAMIN) tablet Take 1 tablet by mouth daily.    Historical Provider, MD   BP 105/59 mmHg  Pulse 62  Temp(Src) 97.3 F (36.3 C) (Oral)  Resp 13  Ht  (1.676 m)  Wt 99.791 kg  BMI 35.53 kg/m2  SpO2 95% Physical Exam  Constitutional: She is oriented to person, place, and time. She appears well-developed and well-nourished.  HENT:  Head: Normocephalic and atraumatic.  Right Ear:  Tympanic membrane and external ear normal.  Left Ear: Tympanic membrane and external ear normal.  Nose: Nose normal. Right sinus exhibits no maxillary sinus tenderness and no frontal sinus tenderness. Left sinus exhibits no maxillary sinus tenderness and no frontal sinus tenderness.  Eyes: Conjunctivae and EOM are normal. Pupils are equal, round, and reactive to light. Right eye exhibits no nystagmus. Left eye exhibits no nystagmus.  Neck: Normal range of motion. Neck supple.  Cardiovascular: Normal rate, regular rhythm, normal heart sounds and intact distal pulses.   Pulmonary/Chest: Effort normal and breath sounds normal. No respiratory distress. She exhibits no tenderness.  Abdominal: Soft. Bowel sounds are normal. She exhibits no distension and no mass. There is no  tenderness.  Musculoskeletal: Normal range of motion. She exhibits no edema or tenderness.  Neurological: She is alert and oriented to person, place, and time. She has normal strength. No sensory deficit. She displays a negative Romberg sign. GCS eye subscore is 4. GCS verbal subscore is 5. GCS motor subscore is 6.  Reflex Scores:      Brachioradialis reflexes are 1+ on the right side and 1+ on the left side.      Patellar reflexes are 2+ on the right side and 2+ on the left side. Speech is normal without dysarthria, dysphasia, or aphasia. Muscle strength is 5/5 upper and lower extremities. There is no palmar drift or leg drift.  No ataxia noted on  finger nose or lower extremity heel-to-shin. Normal gait  Skin: Skin is warm and dry. No rash noted.  Psychiatric: She has a normal mood and affect. Her behavior is normal. Judgment and thought content normal.  Nursing note and vitals reviewed.   ED Course  Procedures (including critical care time) Labs Review Labs Reviewed  CBG MONITORING, ED - Abnormal; Notable for the following:    Glucose-Capillary 116 (*)    All other components within normal limits  BASIC METABOLIC PANEL  CBC  URINALYSIS, ROUTINE W REFLEX MICROSCOPIC (NOT AT [facility])  COMPREHENSIVE METABOLIC PANEL  POCT CBG (FASTING - GLUCOSE)-MANUAL ENTRY  I-STAT TROPOININ, ED    Imaging Review Dg Chest 2 View  07/11/2015  CLINICAL DATA:  Near syncopal event this morning. Asymptomatic presently. EXAM: CHEST  2 VIEW COMPARISON:  12/09/2008 FINDINGS: Heart size is normal. Mediastinal shadows are normal. The lungs are clear. No bronchial thickening. No infiltrate, mass, effusion or collapse. Pulmonary vascularity is normal. No bony abnormality. IMPRESSION: Normal chest Electronically Signed   By: Paulina FusiMark  Shogry M.D.   On: 07/11/2015 08:25   Ct Head Wo Contrast  07/11/2015  CLINICAL DATA:  Dizziness for 2 weeks while walking EXAM: CT HEAD WITHOUT CONTRAST TECHNIQUE: Contiguous axial images  were obtained from the base of the skull through the vertex without intravenous contrast. COMPARISON:  None. FINDINGS: Brain parenchyma, ventricular system, and extra-axial space are within normal limits. No mass effect, midline shift, or acute hemorrhage. Cranium is intact. IMPRESSION: No acute intracranial pathology. Electronically Signed   By: Jolaine ClickArthur  Hoss M.D.   On: 07/11/2015 08:25   I have personally reviewed and evaluated these images and lab results as part of my medical decision-making.   EKG Interpretation   Date/Time:  Friday July 11 2015 06:22:06 EDT Ventricular Rate:  70 PR Interval:  130 QRS Duration: 94 QT Interval:  437 QTC Calculation: 472 R Axis:   77 Text Interpretation:  Sinus rhythm artifact noted Non-specific ST-t  changes No previous ECGs available Confirmed by Bebe ShaggyWICKLINE  MD, DONALD  [phone])  on 07/11/2015 6:34:55 AM Also confirmed by Bebe Shaggy  MD, DONALD  [phone]), editor Wandalee Ferdinand [phone])  on 07/11/2015 7:33:21 AM      MDM   Final diagnoses:  Weakness  Lightheaded   This is a 60 year old female who comes in today feeling weak for the past several weeks. The initial event occurred after she had new glasses. She has felt like she has been listing to the right thumb. She has a normal head CT here which aren't expected to show some abnormality after 2 weeks of symptoms. Mole neurological exam here. She is ambulating here without difficulty. Today she felt somewhat lightheaded. She has a normal EKG G with out acutely ischemic changes. Electrolytes are normal, troponin is normal. Patient has remained hemodynamically stable without neurological deficits while here in the emergency department. I discussed possible causes of near syncope such as cardiac etiology, volume depletion, or electrolyte abnormalities. Affect is unlikely given her normal workup here that this is the cause. However, I have instructed her regarding return precautions and she voices understanding. We  specifically discussed return for return of lateralized symptoms, worsening of lightheadedness, chest pain, or passing out. She is to follow-up with her primary care next week. She has been on a diet for the past week. We have discussed maintaining a healthy diet but increasing her intake over the next several days until she is able to be rechecked as this may be contributing to her symptoms. Discharge instructions Return for worsening symptoms such as weakness, passing out, chest pain, or shortness of breath. Recheck with your doctor early next week Continue healthy fat and sodium restricted diet but increase her calories and make sure you're drinking plenty of fluids    Margarita Grizzle, MD 07/11/15 [phone]

## 2015-07-11 NOTE — Discharge Instructions (Signed)
Return for worsening symptoms such as weakness, passing out, chest pain, or shortness of breath. Recheck with your doctor early next week Continue healthy fat and sodium restricted diet but increase her calories and make sure you're drinking plenty of fluids

## 2015-11-07 ENCOUNTER — Ambulatory Visit (INDEPENDENT_AMBULATORY_CARE_PROVIDER_SITE_OTHER): Payer: 59 | Admitting: Family Medicine

## 2015-11-07 ENCOUNTER — Encounter: Payer: Self-pay | Admitting: Family Medicine

## 2015-11-07 VITALS — BP 93/64 | HR 83 | Temp 97.1°F | Ht 66.0 in | Wt 216.5 lb

## 2015-11-07 DIAGNOSIS — J0100 Acute maxillary sinusitis, unspecified: Secondary | ICD-10-CM

## 2015-11-07 DIAGNOSIS — F32A Depression, unspecified: Secondary | ICD-10-CM | POA: Insufficient documentation

## 2015-11-07 DIAGNOSIS — F329 Major depressive disorder, single episode, unspecified: Secondary | ICD-10-CM | POA: Insufficient documentation

## 2015-11-07 MED ORDER — PSEUDOEPHEDRINE-GUAIFENESIN ER 120-1200 MG PO TB12: 1.0000 | ORAL_TABLET | Freq: Two times a day (BID) | ORAL | 0 refills | Status: DC

## 2015-11-07 MED ORDER — BETAMETHASONE SOD PHOS & ACET 6 (3-3) MG/ML IJ SUSP
6.0000 mg | Freq: Once | INTRAMUSCULAR | Status: AC
  Administered 2015-11-07: 6 mg via INTRAMUSCULAR

## 2015-11-07 MED ORDER — AMOXICILLIN-POT CLAVULANATE 875-125 MG PO TABS: 1.0000 | ORAL_TABLET | Freq: Two times a day (BID) | ORAL | 0 refills | Status: DC

## 2015-11-07 MED ORDER — BUPROPION HCL ER (XL) 150 MG PO TB24: 150.0000 mg | ORAL_TABLET | Freq: Every day | ORAL | 2 refills | Status: DC

## 2015-11-07 NOTE — Progress Notes (Signed)
Subjective:  Patient ID: Nancy HarriesRobin C Lira, female    DOB: 02/28/1955  Age: 60 y.o. MRN: [id number]  CC: Sinusitis   HPI Nancy Chambers presents for Symptoms include congestion, facial pain, nasal congestion, no  fever, hyperosmia, post nasal drip and sinus pressure with no fever, chills, night sweats or weight loss. Onset of symptoms was a month ago, gradually worsening since that time.  History Zella BallRobin has a past medical history of Allergy; Anxiety; and Depression.   She has a past surgical history that includes Dilation and curettage of uterus; Varicose vein surgery; and Broken nose surg.   Her family history includes Heart attack in her father and mother; Hypertension in her mother; Liver cancer in her mother; Lung cancer in her sister; Pancreatic cancer in her mother; Stroke in her mother.She reports that she has quit smoking. She has never used smokeless tobacco. She reports that she does not drink alcohol or use drugs.    ROS Review of Systems  Constitutional: Negative for activity change, appetite change, chills and fever.  HENT: Positive for congestion, postnasal drip, rhinorrhea and sinus pressure. Negative for ear discharge, ear pain, hearing loss, nosebleeds, sneezing and trouble swallowing.   Respiratory: Negative for chest tightness and shortness of breath.   Cardiovascular: Negative for chest pain and palpitations.  Skin: Negative for rash.  Psychiatric/Behavioral: Positive for decreased concentration and dysphoric mood. The patient is nervous/anxious.     Objective:  BP 93/64   Pulse 83   Temp 97.1 F (36.2 C) (Oral)   Ht 5\' 6"  (1.676 m)   Wt 216 lb 8 oz (98.2 kg)   BMI 34.94 kg/m   BP Readings from Last 3 Encounters:  11/07/15 93/64  07/11/15 112/58  07/08/15 102/64    Wt Readings from Last 3 Encounters:  11/07/15 216 lb 8 oz (98.2 kg)  07/11/15 220 lb (99.8 kg)  07/08/15 220 lb 6.4 oz (100 kg)     Physical Exam  Constitutional: She is oriented to person,  place, and time. She appears well-developed and well-nourished.  HENT:  Head: Normocephalic and atraumatic.  Right Ear: Tympanic membrane and external ear normal. No decreased hearing is noted.  Left Ear: Tympanic membrane and external ear normal. No decreased hearing is noted.  Nose: Mucosal edema present. Right sinus exhibits no frontal sinus tenderness. Left sinus exhibits no frontal sinus tenderness.  Mouth/Throat: No oropharyngeal exudate or posterior oropharyngeal erythema.  Neck: No Brudzinski's sign noted.  Pulmonary/Chest: Breath sounds normal. No respiratory distress.  Musculoskeletal: Normal range of motion.  Lymphadenopathy:       Head (right side): No preauricular adenopathy present.       Head (left side): No preauricular adenopathy present.       Right cervical: No superficial cervical adenopathy present.      Left cervical: No superficial cervical adenopathy present.  Neurological: She is alert and oriented to person, place, and time.     Lab Results  Component Value Date   WBC 4.8 07/11/2015   HGB 12.5 07/11/2015   HCT 40.4 07/11/2015   PLT 216 07/11/2015   GLUCOSE 100 (H) 07/11/2015   CHOL 246 (H) 03/03/2015   TRIG 110 03/03/2015   HDL 64 03/03/2015   LDLCALC 160 (H) 03/03/2015   ALT 17 07/11/2015   AST 18 07/11/2015   NA 138 07/11/2015   K 4.0 07/11/2015   CL 108 07/11/2015   CREATININE 0.81 07/11/2015   BUN 21 (H) 07/11/2015   CO2 24  07/11/2015   TSH 3.540 09/09/2014   HGBA1C 5.1 04/04/2014    Dg Chest 2 View  Result Date: 07/11/2015 CLINICAL DATA:  Near syncopal event this morning. Asymptomatic presently. EXAM: CHEST  2 VIEW COMPARISON:  12/09/2008 FINDINGS: Heart size is normal. Mediastinal shadows are normal. The lungs are clear. No bronchial thickening. No infiltrate, mass, effusion or collapse. Pulmonary vascularity is normal. No bony abnormality. IMPRESSION: Normal chest Electronically Signed   By: Paulina Fusi M.D.   On: 07/11/2015 08:25   Ct  Head Wo Contrast  Result Date: 07/11/2015 CLINICAL DATA:  Dizziness for 2 weeks while walking EXAM: CT HEAD WITHOUT CONTRAST TECHNIQUE: Contiguous axial images were obtained from the base of the skull through the vertex without intravenous contrast. COMPARISON:  None. FINDINGS: Brain parenchyma, ventricular system, and extra-axial space are within normal limits. No mass effect, midline shift, or acute hemorrhage. Cranium is intact. IMPRESSION: No acute intracranial pathology. Electronically Signed   By: Jolaine Click M.D.   On: 07/11/2015 08:25    Assessment & Plan:   Stacee was seen today for sinusitis.  Diagnoses and all orders for this visit:  Acute maxillary sinusitis, recurrence not specified -     betamethasone acetate-betamethasone sodium phosphate (CELESTONE) injection 6 mg; Inject 1 mL (6 mg total) into the muscle once.  Depression  Other orders -     amoxicillin-clavulanate (AUGMENTIN) 875-125 MG tablet; Take 1 tablet by mouth 2 (two) times daily. Take all of this medication -     buPROPion (WELLBUTRIN XL) 150 MG 24 hr tablet; Take 1 tablet (150 mg total) by mouth daily. -     Pseudoephedrine-Guaifenesin [phone] MG TB12; Take 1 tablet by mouth 2 (two) times daily. For congestion      I have discontinued Ms. Kruczek's LECITHIN PO, diclofenac, and meclizine. I am also having her start on amoxicillin-clavulanate, buPROPion, and Pseudoephedrine-Guaifenesin. Additionally, I am having her maintain her multivitamin, Cholecalciferol (VITAMIN D PO), and (Green Tea, Camillia sinensis, (GREEN TEA EXTRACT PO)). We will continue to administer betamethasone acetate-betamethasone sodium phosphate.  Meds ordered this encounter  Medications  . amoxicillin-clavulanate (AUGMENTIN) 875-125 MG tablet    Sig: Take 1 tablet by mouth 2 (two) times daily. Take all of this medication    Dispense:  20 tablet    Refill:  0  . buPROPion (WELLBUTRIN XL) 150 MG 24 hr tablet    Sig: Take 1 tablet (150 mg  total) by mouth daily.    Dispense:  30 tablet    Refill:  2  . betamethasone acetate-betamethasone sodium phosphate (CELESTONE) injection 6 mg  . Pseudoephedrine-Guaifenesin [phone] MG TB12    Sig: Take 1 tablet by mouth 2 (two) times daily. For congestion    Dispense:  20 each    Refill:  0     Follow-up: Return in about 6 weeks (around 12/19/2015).  Mechele Claude, M.D.

## 2015-11-13 ENCOUNTER — Telehealth: Payer: Self-pay | Admitting: Family Medicine

## 2015-11-14 MED ORDER — AZITHROMYCIN 250 MG PO TABS
ORAL_TABLET | ORAL | 0 refills | Status: DC
Start: 2015-11-14 — End: 2015-11-26

## 2015-11-14 NOTE — Telephone Encounter (Signed)
Rx ordered. Patient aware.

## 2015-11-14 NOTE — Telephone Encounter (Signed)
Pt d/ced Augmentin Pt felt like it was making her more short of breath Has continued congestion, drainage and cough Can another med be RXed please advise

## 2015-11-14 NOTE — Telephone Encounter (Signed)
Give her Z-Pak

## 2015-11-26 ENCOUNTER — Ambulatory Visit (INDEPENDENT_AMBULATORY_CARE_PROVIDER_SITE_OTHER): Payer: 59 | Admitting: Family Medicine

## 2015-11-26 ENCOUNTER — Ambulatory Visit (INDEPENDENT_AMBULATORY_CARE_PROVIDER_SITE_OTHER): Payer: 59

## 2015-11-26 ENCOUNTER — Encounter: Payer: Self-pay | Admitting: Family Medicine

## 2015-11-26 VITALS — BP 138/73 | HR 73 | Temp 97.5°F | Ht 66.0 in | Wt 223.1 lb

## 2015-11-26 DIAGNOSIS — M545 Low back pain, unspecified: Secondary | ICD-10-CM

## 2015-11-26 DIAGNOSIS — R1031 Right lower quadrant pain: Secondary | ICD-10-CM | POA: Diagnosis not present

## 2015-11-26 LAB — MICROSCOPIC EXAMINATION
Bacteria, UA: NONE SEEN
EPITHELIAL CELLS (NON RENAL): NONE SEEN /HPF (ref 0–10)
RBC, UA: NONE SEEN /hpf (ref 0–?)

## 2015-11-26 LAB — URINALYSIS, COMPLETE
BILIRUBIN UA: NEGATIVE
GLUCOSE, UA: NEGATIVE
KETONES UA: NEGATIVE
LEUKOCYTES UA: NEGATIVE
Nitrite, UA: NEGATIVE
PROTEIN UA: NEGATIVE
RBC UA: NEGATIVE
SPEC GRAV UA: 1.01 (ref 1.005–1.030)
Urobilinogen, Ur: 0.2 mg/dL (ref 0.2–1.0)
pH, UA: 5.5 (ref 5.0–7.5)

## 2015-11-26 IMAGING — DX DG HIP (WITH OR WITHOUT PELVIS) 2-3V*L*
3 series · 3 of 3 positions shown · non-contrast
Comparison: Lumbar spine [DATE]

CLINICAL DATA: Left hip pain.

EXAM:
DG HIP (WITH OR WITHOUT PELVIS) 2-3V LEFT

[pelvis ap]
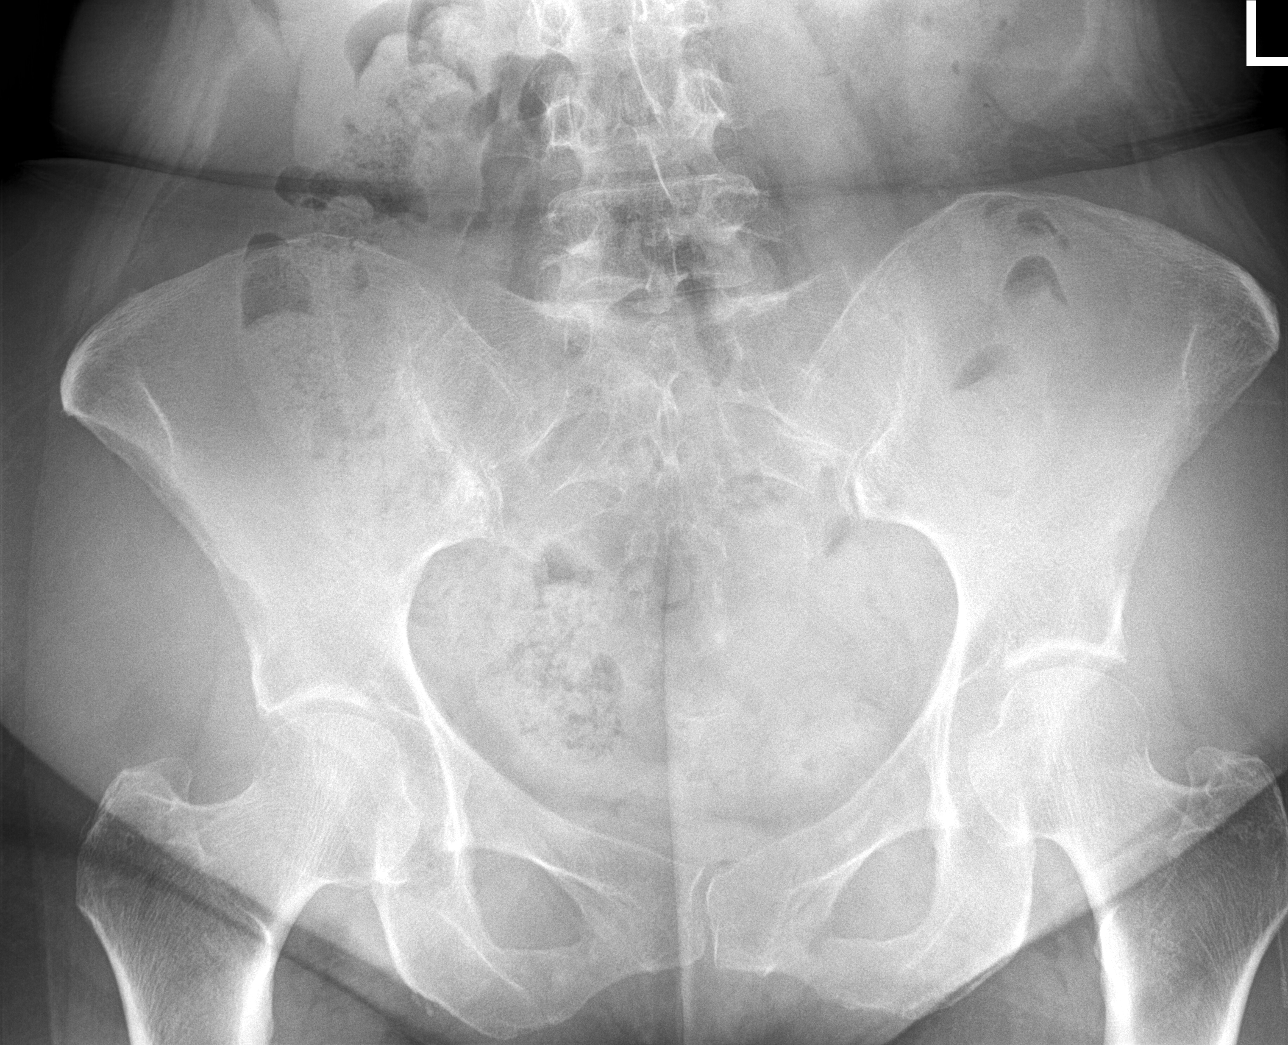

[hip ap]
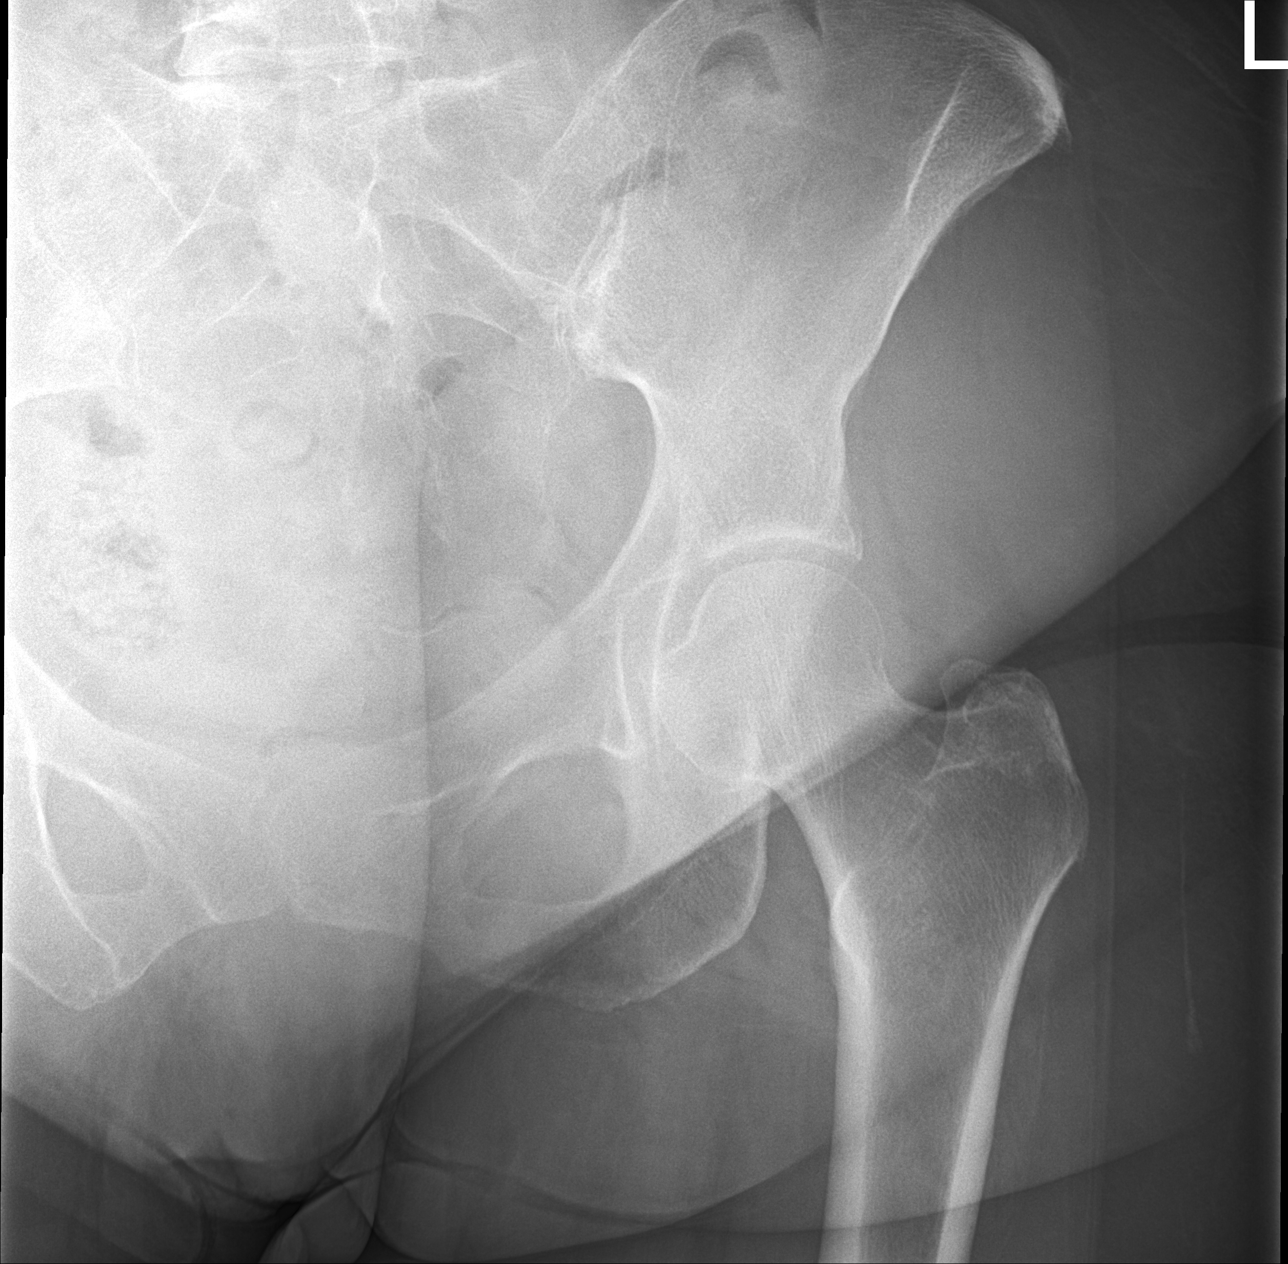

[hip lat]
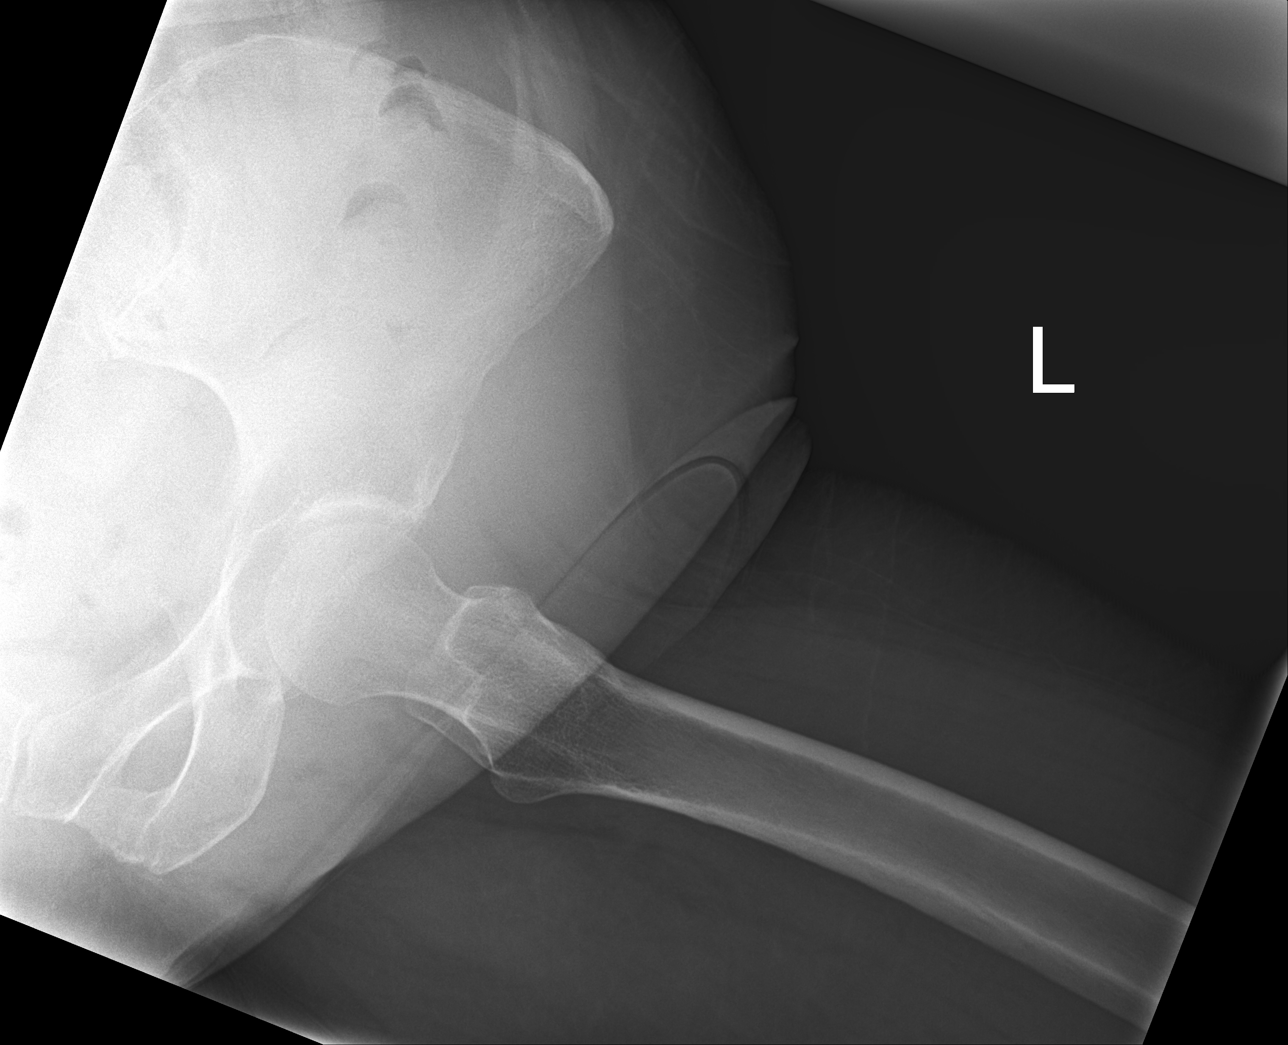

[3 of 3 positions shown; findings below may reference images not displayed]

FINDINGS: There is no acute fracture or subluxation. No radiopaque foreign
body.
IMPRESSION: No evidence for acute  abnormality.

## 2015-11-26 MED ORDER — MELOXICAM 15 MG PO TABS: 15.0000 mg | ORAL_TABLET | Freq: Every day | ORAL | 0 refills | Status: DC

## 2015-11-26 MED ORDER — CYCLOBENZAPRINE HCL 10 MG PO TABS: 10.0000 mg | ORAL_TABLET | Freq: Three times a day (TID) | ORAL | 0 refills | Status: DC | PRN

## 2015-11-26 MED ORDER — METHYLPREDNISOLONE ACETATE 80 MG/ML IJ SUSP
80.0000 mg | Freq: Once | INTRAMUSCULAR | Status: AC
  Administered 2015-11-26: 80 mg via INTRAMUSCULAR

## 2015-11-26 NOTE — Progress Notes (Signed)
BP 138/73   Pulse 73   Temp 97.5 F (36.4 C) (Oral)   Ht 5\' 6"  (1.676 m)   Wt 223 lb 2 oz (101.2 kg)   BMI 36.01 kg/m    Subjective:    Patient ID: [name], female    DOB: 06-10-1955, 60 y.o.   MRN: [id number]  HPI: Nancy Chambers is a 60 y.o. female presenting on 11/26/2015 for Left hip, leg and groin pain (has been using a heating pad and Ibuprofen)   HPI Left lower back pain radiating around to the groin on left and right. Patient has left lower back pain that radiate around into her left groin and right groin. She says it's been going on for about a week. She did say previous to that she was having some issues with her knee and that she doesn't know if that has started to cause issues in her hip. She denies any fevers or chills or overlying redness or warmth. The pain is described as vague goal pain but not sharp in nature. The pain is worse with ambulation. She denies any dysuria or hematuria that she's noticed. Her urine has been darker than normal.  Relevant past medical, surgical, family and social history reviewed and updated as indicated. Interim medical history since our last visit reviewed. Allergies and medications reviewed and updated.  Review of Systems  Constitutional: Negative for chills and fever.  HENT: Negative for congestion, ear discharge and ear pain.   Eyes: Negative for redness and visual disturbance.  Respiratory: Negative for chest tightness and shortness of breath.   Cardiovascular: Negative for chest pain and leg swelling.  Gastrointestinal: Positive for abdominal pain. Negative for constipation, diarrhea, nausea and vomiting.  Genitourinary: Positive for flank pain and frequency. Negative for decreased urine volume, difficulty urinating, dysuria, urgency, vaginal bleeding, vaginal discharge and vaginal pain.  Musculoskeletal: Positive for arthralgias, back pain and myalgias. Negative for gait problem.  Skin: Negative for rash.  Neurological:  Negative for light-headedness and headaches.  Psychiatric/Behavioral: Negative for agitation and behavioral problems.  All other systems reviewed and are negative.   Per HPI unless specifically indicated above     Medication List       Accurate as of 11/26/15 10:37 AM. Always use your most recent med list.          buPROPion 150 MG 24 hr tablet Commonly known as:  WELLBUTRIN XL Take 1 tablet (150 mg total) by mouth daily.   GREEN TEA EXTRACT PO Take by mouth.   multivitamin tablet Take 1 tablet by mouth daily.   VITAMIN B 12 PO Take by mouth.   VITAMIN D PO Take by mouth.          Objective:    BP 138/73   Pulse 73   Temp 97.5 F (36.4 C) (Oral)   Ht 5\' 6"  (1.676 m)   Wt 223 lb 2 oz (101.2 kg)   BMI 36.01 kg/m   Wt Readings from Last 3 Encounters:  11/26/15 223 lb 2 oz (101.2 kg)  11/07/15 216 lb 8 oz (98.2 kg)  07/11/15 220 lb (99.8 kg)    Physical Exam  Constitutional: She is oriented to person, place, and time. She appears well-developed and well-nourished. No distress.  Eyes: Conjunctivae are normal.  Cardiovascular: Normal rate, regular rhythm, normal heart sounds and intact distal pulses.   No murmur heard. Pulmonary/Chest: Effort normal and breath sounds normal. No respiratory distress. She has no wheezes. She has  no rales.  Abdominal: Soft. Bowel sounds are normal. She exhibits no distension. There is no hepatosplenomegaly. There is tenderness in the right lower quadrant and suprapubic area. There is no rigidity, no rebound, no guarding and no CVA tenderness.  Musculoskeletal: Normal range of motion. She exhibits no edema.       Lumbar back: She exhibits tenderness.       Back:  Neurological: She is alert and oriented to person, place, and time. Coordination normal.  Skin: Skin is warm and dry. No rash noted. She is not diaphoretic.  Psychiatric: She has a normal mood and affect. Her behavior is normal.  Nursing note and vitals  reviewed.  Left hip x-ray:Minimal arthritis but no acute processes, await final read by radiology.    Assessment & Plan:   Problem List Items Addressed This Visit    None    Visit Diagnoses    Right lower quadrant abdominal pain    -  Primary   And suprapubic, we'll test urine, urine came back normal, will continue to monitor for signs of worsening.   Relevant Orders   Urinalysis, Complete   Acute left-sided low back pain without sciatica       Relevant Medications   methylPREDNISolone acetate (DEPO-MEDROL) injection 80 mg (Start on 11/26/2015 11:45 AM)   meloxicam (MOBIC) 15 MG tablet   cyclobenzaprine (FLEXERIL) 10 MG tablet   Other Relevant Orders   DG HIP UNILAT W OR W/O PELVIS 2-3 VIEWS LEFT       Follow up plan: Return if symptoms worsen or fail to improve.  Counseling provided for all of the vaccine components Orders Placed This Encounter  Procedures  . Urinalysis, Complete    Arville CareJoshua Dettinger, MD [facility] 11/26/2015, 10:37 AM

## 2015-11-27 ENCOUNTER — Telehealth: Payer: Self-pay | Admitting: Family Medicine

## 2015-11-27 NOTE — Telephone Encounter (Signed)
Talked w/ patient about her pain not any better and her medications. Instructed her that she is able to take Tylenol in between but Mobic is only a once a day medication & to keep her flexeril for the evenings when she is at home since it makes her sleeping. Please let us know how she is doing.

## 2016-02-10 ENCOUNTER — Encounter: Payer: 59 | Admitting: Gynecology

## 2016-02-24 ENCOUNTER — Encounter: Payer: Self-pay | Admitting: Physician Assistant

## 2016-02-24 ENCOUNTER — Ambulatory Visit (INDEPENDENT_AMBULATORY_CARE_PROVIDER_SITE_OTHER): Payer: 59 | Admitting: Physician Assistant

## 2016-02-24 VITALS — BP 106/72 | HR 87 | Temp 98.2°F | Ht 66.0 in | Wt 220.6 lb

## 2016-02-24 DIAGNOSIS — Z0000 Encounter for general adult medical examination without abnormal findings: Secondary | ICD-10-CM | POA: Diagnosis not present

## 2016-02-24 DIAGNOSIS — E559 Vitamin D deficiency, unspecified: Secondary | ICD-10-CM | POA: Insufficient documentation

## 2016-02-24 DIAGNOSIS — M1990 Unspecified osteoarthritis, unspecified site: Secondary | ICD-10-CM

## 2016-02-24 MED ORDER — NAPROXEN 500 MG PO TABS: 500.0000 mg | ORAL_TABLET | Freq: Two times a day (BID) | ORAL | 6 refills | Status: DC

## 2016-02-24 MED ORDER — DULOXETINE HCL 30 MG PO CPEP: 30.0000 mg | ORAL_CAPSULE | Freq: Every day | ORAL | 0 refills | Status: DC

## 2016-02-24 NOTE — Patient Instructions (Signed)
Back Exercises Introduction If you have pain in your back, do these exercises 2-3 times each day or as told by your doctor. When the pain goes away, do the exercises once each day, but repeat the steps more times for each exercise (do more repetitions). If you do not have pain in your back, do these exercises once each day or as told by your doctor. Exercises Single Knee to Chest  Do these steps 3-5 times in a row for each leg: 1. Lie on your back on a firm bed or the floor with your legs stretched out. 2. Bring one knee to your chest. 3. Hold your knee to your chest by grabbing your knee or thigh. 4. Pull on your knee until you feel a gentle stretch in your lower back. 5. Keep doing the stretch for 10-30 seconds. 6. Slowly let go of your leg and straighten it. Pelvic Tilt  Do these steps 5-10 times in a row: 1. Lie on your back on a firm bed or the floor with your legs stretched out. 2. Bend your knees so they point up to the ceiling. Your feet should be flat on the floor. 3. Tighten your lower belly (abdomen) muscles to press your lower back against the floor. This will make your tailbone point up to the ceiling instead of pointing down to your feet or the floor. 4. Stay in this position for 5-10 seconds while you gently tighten your muscles and breathe evenly. Cat-Cow  Do these steps until your lower back bends more easily: 1. Get on your hands and knees on a firm surface. Keep your hands under your shoulders, and keep your knees under your hips. You may put padding under your knees. 2. Let your head hang down, and make your tailbone point down to the floor so your lower back is round like the back of a cat. 3. Stay in this position for 5 seconds. 4. Slowly lift your head and make your tailbone point up to the ceiling so your back hangs low (sags) like the back of a cow. 5. Stay in this position for 5 seconds. Press-Ups  Do these steps 5-10 times in a row: 1. Lie on your belly  (face-down) on the floor. 2. Place your hands near your head, about shoulder-width apart. 3. While you keep your back relaxed and keep your hips on the floor, slowly straighten your arms to raise the top half of your body and lift your shoulders. Do not use your back muscles. To make yourself more comfortable, you may change where you place your hands. 4. Stay in this position for 5 seconds. 5. Slowly return to lying flat on the floor. Bridges  Do these steps 10 times in a row: 1. Lie on your back on a firm surface. 2. Bend your knees so they point up to the ceiling. Your feet should be flat on the floor. 3. Tighten your butt muscles and lift your butt off of the floor until your waist is almost as high as your knees. If you do not feel the muscles working in your butt and the back of your thighs, slide your feet 1-2 inches farther away from your butt. 4. Stay in this position for 3-5 seconds. 5. Slowly lower your butt to the floor, and let your butt muscles relax. If this exercise is too easy, try doing it with your arms crossed over your chest. Belly Crunches  Do these steps 5-10 times in a row: 1. Lie   on your back on a firm bed or the floor with your legs stretched out. 2. Bend your knees so they point up to the ceiling. Your feet should be flat on the floor. 3. Cross your arms over your chest. 4. Tip your chin a little bit toward your chest but do not bend your neck. 5. Tighten your belly muscles and slowly raise your chest just enough to lift your shoulder blades a tiny bit off of the floor. 6. Slowly lower your chest and your head to the floor. Back Lifts  Do these steps 5-10 times in a row: 1. Lie on your belly (face-down) with your arms at your sides, and rest your forehead on the floor. 2. Tighten the muscles in your legs and your butt. 3. Slowly lift your chest off of the floor while you keep your hips on the floor. Keep the back of your head in line with the curve in your back.  Look at the floor while you do this. 4. Stay in this position for 3-5 seconds. 5. Slowly lower your chest and your face to the floor. Contact a doctor if:  Your back pain gets a lot worse when you do an exercise.  Your back pain does not lessen 2 hours after you exercise. If you have any of these problems, stop doing the exercises. Do not do them again unless your doctor says it is okay. Get help right away if:  You have sudden, very bad back pain. If this happens, stop doing the exercises. Do not do them again unless your doctor says it is okay. This information is not intended to replace advice given to you by your health care provider. Make sure you discuss any questions you have with your health care provider. Document Released: 02/27/2010 Document Revised: 07/03/2015 Document Reviewed: 03/21/2014  2017 Elsevier  

## 2016-02-25 LAB — CMP14+EGFR
ALBUMIN: 4.6 g/dL (ref 3.5–5.5)
ALK PHOS: 65 IU/L (ref 39–117)
ALT: 17 IU/L (ref 0–32)
AST: 15 IU/L (ref 0–40)
Albumin/Globulin Ratio: 2.9 — ABNORMAL HIGH (ref 1.2–2.2)
BUN / CREAT RATIO: 15 (ref 9–23)
BUN: 13 mg/dL (ref 6–24)
Bilirubin Total: 0.5 mg/dL (ref 0.0–1.2)
CALCIUM: 9.3 mg/dL (ref 8.7–10.2)
CO2: 24 mmol/L (ref 18–29)
CREATININE: 0.85 mg/dL (ref 0.57–1.00)
Chloride: 104 mmol/L (ref 96–106)
GFR calc Af Amer: 87 mL/min/{1.73_m2} (ref >59)
GFR calc non Af Amer: 76 mL/min/{1.73_m2} (ref >59)
GLOBULIN, TOTAL: 1.6 g/dL (ref 1.5–4.5)
Glucose: 89 mg/dL (ref 65–99)
Potassium: 4.7 mmol/L (ref 3.5–5.2)
SODIUM: 142 mmol/L (ref 134–144)
Total Protein: 6.2 g/dL (ref 6.0–8.5)

## 2016-02-25 LAB — CBC WITH DIFFERENTIAL/PLATELET
BASOS ABS: 0 10*3/uL (ref 0.0–0.2)
Basos: 1 %
EOS (ABSOLUTE): 0.3 10*3/uL (ref 0.0–0.4)
EOS: 5 %
HEMATOCRIT: 39.7 % (ref 34.0–46.6)
HEMOGLOBIN: 13 g/dL (ref 11.1–15.9)
IMMATURE GRANS (ABS): 0 10*3/uL (ref 0.0–0.1)
IMMATURE GRANULOCYTES: 0 %
LYMPHS ABS: 1.7 10*3/uL (ref 0.7–3.1)
LYMPHS: 34 %
MCH: 30.2 pg (ref 26.6–33.0)
MCHC: 32.7 g/dL (ref 31.5–35.7)
MCV: 92 fL (ref 79–97)
MONOCYTES: 6 %
Monocytes Absolute: 0.3 10*3/uL (ref 0.1–0.9)
NEUTROS PCT: 54 %
Neutrophils Absolute: 2.6 10*3/uL (ref 1.4–7.0)
Platelets: 249 10*3/uL (ref 150–379)
RBC: 4.3 x10E6/uL (ref 3.77–5.28)
RDW: 14 % (ref 12.3–15.4)
WBC: 4.9 10*3/uL (ref 3.4–10.8)

## 2016-02-25 LAB — TSH: TSH: 3.42 u[IU]/mL (ref 0.450–4.500)

## 2016-02-25 LAB — LIPID PANEL
CHOL/HDL RATIO: 3.6 ratio (ref 0.0–4.4)
Cholesterol, Total: 226 mg/dL — ABNORMAL HIGH (ref 100–199)
HDL: 62 mg/dL (ref >39)
LDL Calculated: 138 mg/dL — ABNORMAL HIGH (ref 0–99)
Triglycerides: 128 mg/dL (ref 0–149)
VLDL Cholesterol Cal: 26 mg/dL (ref 5–40)

## 2016-02-25 LAB — VITAMIN D 25 HYDROXY (VIT D DEFICIENCY, FRACTURES): Vit D, 25-Hydroxy: 42.2 ng/mL (ref 30.0–100.0)

## 2016-02-26 NOTE — Progress Notes (Signed)
BP 106/72   Pulse 87   Temp 98.2 F (36.8 C) (Oral)   Ht _0  (1.676 m)   Wt 220 lb 9.6 oz (100.1 kg)   BMI 35.61 kg/m    Subjective:    Patient ID: Nancy Chambers, female    DOB: 1955/11/12, 61 y.o.   MRN: [id number]  HPI: Nancy Chambers is a 61 y.o. female presenting on 02/24/2016 for Annual Exam  This patient comes in for annual well physical examination. All medications are reviewed today. There are no reports of any problems with the medications. All of the medical conditions are reviewed and updated.  Lab work is reviewed and will be ordered as medically necessary. There are no new problems reported with today's visit.  Patient reports doing well overall.   Past Medical History:  Diagnosis Date  . Allergy   . Anxiety   . Depression    Relevant past medical, surgical, family and social history reviewed and updated as indicated. Interim medical history since our last visit reviewed. Allergies and medications reviewed and updated. DATA REVIEWED: CHART IN EPIC  Social History   Social History  . Marital status: Married    Spouse name: N/A  . Number of children: N/A  . Years of education: N/A   Occupational History  . Not on file.   Social History Main Topics  . Smoking status: Former Research scientist (life sciences)  . Smokeless tobacco: Never Used  . Alcohol use No  . Drug use: No  . Sexual activity: Not Currently    Birth control/ protection: Post-menopausal   Other Topics Concern  . Not on file   Social History Narrative  . No narrative on file    Past Surgical History:  Procedure Laterality Date  . Broken nose surg    . DILATION AND CURETTAGE OF UTERUS    . VARICOSE VEIN SURGERY      Family History  Problem Relation Age of Onset  . Pancreatic cancer Mother   . Liver cancer Mother   . Stroke Mother   . Hypertension Mother   . Heart attack Mother   . Heart attack Father   . Lung cancer Sister     Review of Systems  Constitutional: Negative.  Negative for activity  change, fatigue and fever.  HENT: Negative.   Eyes: Negative.   Respiratory: Negative.  Negative for cough.   Cardiovascular: Negative.  Negative for chest pain.  Gastrointestinal: Negative.  Negative for abdominal pain.  Endocrine: Negative.   Genitourinary: Negative.  Negative for dysuria.  Musculoskeletal: Negative.   Skin: Negative.   Neurological: Negative.     Allergies as of 02/24/2016      Reactions   Prozac [fluoxetine Hcl] Rash      Medication List       Accurate as of 02/24/16 11:59 PM. Always use your most recent med list.          DULoxetine 30 MG capsule Commonly known as:  CYMBALTA Take 1 capsule (30 mg total) by mouth daily. Then 2 tab QAM after 1 week   GREEN TEA EXTRACT PO Take by mouth.   multivitamin tablet Take 1 tablet by mouth daily.   naproxen 500 MG tablet Commonly known as:  NAPROSYN Take 1 tablet (500 mg total) by mouth 2 (two) times daily with a meal.   VITAMIN B 12 PO Take by mouth.   VITAMIN D PO Take by mouth.          Objective:  BP 106/72   Pulse 87   Temp 98.2 F (36.8 C) (Oral)   Ht _0  (1.676 m)   Wt 220 lb 9.6 oz (100.1 kg)   BMI 35.61 kg/m   Allergies  Allergen Reactions  . Prozac [Fluoxetine Hcl] Rash    Wt Readings from Last 3 Encounters:  02/24/16 220 lb 9.6 oz (100.1 kg)  11/26/15 223 lb 2 oz (101.2 kg)  11/07/15 216 lb 8 oz (98.2 kg)    Physical Exam  Constitutional: She is oriented to person, place, and time. She appears well-developed and well-nourished.  HENT:  Head: Normocephalic and atraumatic.  Eyes: Conjunctivae and EOM are normal. Pupils are equal, round, and reactive to light.  Neck: Normal range of motion. Neck supple.  Cardiovascular: Normal rate, regular rhythm, normal heart sounds and intact distal pulses.   Pulmonary/Chest: Effort normal and breath sounds normal. Right breast exhibits no mass, no skin change and no tenderness. Left breast exhibits no mass, no skin change and no  tenderness. Breasts are symmetrical.  Abdominal: Soft. Bowel sounds are normal.  Genitourinary: Vagina normal and uterus normal. Rectal exam shows no fissure. No breast swelling, tenderness, discharge or bleeding. There is no tenderness or lesion on the right labia. There is no tenderness or lesion on the left labia. Uterus is not deviated, not enlarged and not tender. Cervix exhibits no motion tenderness, no discharge and no friability. Right adnexum displays no mass, no tenderness and no fullness. Left adnexum displays no mass, no tenderness and no fullness. No tenderness or bleeding in the vagina. No vaginal discharge found.  Neurological: She is alert and oriented to person, place, and time. She has normal reflexes.  Skin: Skin is warm and dry. No rash noted.  Psychiatric: She has a normal mood and affect. Her behavior is normal. Judgment and thought content normal.    Results for orders placed or performed in visit on 02/24/16  CBC with Differential/Platelet  Result Value Ref Range   WBC 4.9 3.4 - 10.8 x10E3/uL   RBC 4.30 3.77 - 5.28 x10E6/uL   Hemoglobin 13.0 11.1 - 15.9 g/dL   Hematocrit 39.7 34.0 - 46.6 %   MCV 92 79 - 97 fL   MCH 30.2 26.6 - 33.0 pg   MCHC 32.7 31.5 - 35.7 g/dL   RDW 14.0 12.3 - 15.4 %   Platelets 249 150 - 379 x10E3/uL   Neutrophils 54 Not Estab. %   Lymphs 34 Not Estab. %   Monocytes 6 Not Estab. %   Eos 5 Not Estab. %   Basos 1 Not Estab. %   Neutrophils Absolute 2.6 1.4 - 7.0 x10E3/uL   Lymphocytes Absolute 1.7 0.7 - 3.1 x10E3/uL   Monocytes Absolute 0.3 0.1 - 0.9 x10E3/uL   EOS (ABSOLUTE) 0.3 0.0 - 0.4 x10E3/uL   Basophils Absolute 0.0 0.0 - 0.2 x10E3/uL   Immature Granulocytes 0 Not Estab. %   Immature Grans (Abs) 0.0 0.0 - 0.1 x10E3/uL  CMP14+EGFR  Result Value Ref Range   Glucose 89 65 - 99 mg/dL   BUN 13 6 - 24 mg/dL   Creatinine, Ser 0.85 0.57 - 1.00 mg/dL   GFR calc non Af Amer 76 >59 mL/min/1.73   GFR calc Af Amer 87 >59 mL/min/1.73    BUN/Creatinine Ratio 15 9 - 23   Sodium 142 134 - 144 mmol/L   Potassium 4.7 3.5 - 5.2 mmol/L   Chloride 104 96 - 106 mmol/L   CO2 24 18 -  29 mmol/L   Calcium 9.3 8.7 - 10.2 mg/dL   Total Protein 6.2 6.0 - 8.5 g/dL   Albumin 4.6 3.5 - 5.5 g/dL   Globulin, Total 1.6 1.5 - 4.5 g/dL   Albumin/Globulin Ratio 2.9 (H) 1.2 - 2.2   Bilirubin Total 0.5 0.0 - 1.2 mg/dL   Alkaline Phosphatase 65 39 - 117 IU/L   AST 15 0 - 40 IU/L   ALT 17 0 - 32 IU/L  Lipid panel  Result Value Ref Range   Cholesterol, Total 226 (H) 100 - 199 mg/dL   Triglycerides 128 0 - 149 mg/dL   HDL 62 >39 mg/dL   VLDL Cholesterol Cal 26 5 - 40 mg/dL   LDL Calculated 138 (H) 0 - 99 mg/dL   Chol/HDL Ratio 3.6 0.0 - 4.4 ratio units  TSH  Result Value Ref Range   TSH 3.420 0.450 - 4.500 uIU/mL  VITAMIN D 25 Hydroxy (Vit-D Deficiency, Fractures)  Result Value Ref Range   Vit D, 25-Hydroxy 42.2 30.0 - 100.0 ng/mL      Assessment & Plan:   1. Well adult exam - CBC with Differential/Platelet - CMP14+EGFR - Lipid panel - TSH - VITAMIN D 25 Hydroxy (Vit-D Deficiency, Fractures)  2. Arthritis - naproxen (NAPROSYN) 500 MG tablet; Take 1 tablet (500 mg total) by mouth 2 (two) times daily with a meal.  Dispense: 60 tablet; Refill: 6 - DULoxetine (CYMBALTA) 30 MG capsule; Take 1 capsule (30 mg total) by mouth daily. Then 2 tab QAM after 1 week  Dispense: 60 capsule; Refill: 0  3. Vitamin D deficiency - VITAMIN D 25 Hydroxy (Vit-D Deficiency, Fractures)   Continue all other maintenance medications as listed above.  Follow up plan: Return in about 4 weeks (around 03/23/2016) for recheck.  Orders Placed This Encounter  Procedures  . CBC with Differential/Platelet  . CMP14+EGFR  . Lipid panel  . TSH  . VITAMIN D 25 Hydroxy (Vit-D Deficiency, Fractures)    Educational handout given for back exercises  Terald Sleeper PA-C [facility] [address]  [place], [place]  [address] [phone]   02/26/2016, 7:36 PM

## 2016-02-27 ENCOUNTER — Telehealth: Payer: Self-pay | Admitting: Family Medicine

## 2016-02-27 NOTE — Telephone Encounter (Signed)
[email or web address]

## 2016-02-27 NOTE — Telephone Encounter (Signed)
Stop medication now, I will research back through her chart for another medication. Will be Monday before I send the rx in. This gives her time to be clear of the current one. Please send this back to me.

## 2016-03-02 MED ORDER — CITALOPRAM HYDROBROMIDE 20 MG PO TABS: 10.0000 mg | ORAL_TABLET | Freq: Every day | ORAL | 3 refills | Status: DC

## 2016-03-02 NOTE — Telephone Encounter (Signed)
Prescription sent to pharmacy.

## 2016-03-02 NOTE — Telephone Encounter (Signed)
Prescription sent to pharmacy  Keep follow up due to sensitivities to medications.

## 2016-03-02 NOTE — Telephone Encounter (Signed)
Patient aware of recommendations. Patient advised that her [facility]

## 2016-03-15 ENCOUNTER — Ambulatory Visit (INDEPENDENT_AMBULATORY_CARE_PROVIDER_SITE_OTHER): Payer: 59 | Admitting: Physician Assistant

## 2016-03-15 ENCOUNTER — Encounter: Payer: Self-pay | Admitting: Physician Assistant

## 2016-03-15 VITALS — BP 97/69 | HR 81 | Temp 97.6°F | Ht 66.0 in | Wt 222.8 lb

## 2016-03-15 DIAGNOSIS — F320 Major depressive disorder, single episode, mild: Secondary | ICD-10-CM

## 2016-03-15 MED ORDER — VORTIOXETINE HBR 10 MG PO TABS: 10.0000 mg | ORAL_TABLET | Freq: Every day | ORAL | 1 refills | Status: DC

## 2016-03-15 NOTE — Patient Instructions (Signed)
Persistent Depressive Disorder Persistent depressive disorder (PDD) is a mental health condition. PDD causes symptoms of low-level depression for 2 years or longer. It may also be called long-term (chronic) depression or dysthymia. PDD may include episodes of more severe depression that last for about 2 weeks (major depressive disorder or MDD). PDD can affect the way you think, feel, and sleep. This condition may also affect your relationships. You may be more likely to get sick if you have PDD. Symptoms of PDD occur for most of the day and may include:  Feeling tired (fatigue).  Low energy.  Eating too much or too little.  Sleeping too much or too little.  Feeling restless or agitated.  Feeling hopeless.  Feeling worthless or guilty.  Feeling worried or nervous (anxiety).  Trouble concentrating or making decisions.  Low self-esteem.  A negative way of looking at things (outlook).  Not being able to have fun or feel pleasure.  Avoiding interacting with people.  Getting angry or annoyed easily (irritability).  Acting aggressive or angry.  Follow these instructions at home: Activity  Go back to your normal activities as told by your doctor.  Exercise regularly as told by your doctor. General instructions  Take over-the-counter and prescription medicines only as told by your doctor.  Do not drink alcohol. Or, limit how much alcohol you drink to no more than 1 drink a day for nonpregnant women and 2 drinks a day for men. One drink equals 12 oz of beer, 5 oz of wine, or 1 oz of hard liquor. Alcohol can affect any antidepressant medicines you are taking. Talk with your doctor about your alcohol use.  Eat a healthy diet and get plenty of sleep.  Find activities that you enjoy each day.  Consider joining a support group. Your doctor may be able to suggest a support group.  Keep all follow-up visits as told by your doctor. This is important. Where to find more  information: National Alliance on Mental Illness  www.nami.org  U.[place]. National Institute of Mental Health  www.nimh.nih.gov  National Suicide Prevention Lifeline  1-800-273-TALK (1-800-273-8255). This is free, 24-hour help.  Contact a doctor if:  Your symptoms get worse.  You have new symptoms.  You have trouble sleeping or doing your daily activities. Get help right away if:  You self-harm.  You have serious thoughts about hurting yourself or others.  You see, hear, taste, smell, or feel things that are not there (hallucinate). This information is not intended to replace advice given to you by your health care provider. Make sure you discuss any questions you have with your health care provider. Document Released: 01/06/2015 Document Revised: 09/19/2015 Document Reviewed: 09/19/2015 Elsevier Interactive Patient Education  2017 [employer]  

## 2016-03-15 NOTE — Progress Notes (Signed)
BP 97/69   Pulse 81   Temp 97.6 F (36.4 C) (Oral)   Ht 5\' 6"  (1.676 m)   Wt 222 lb 12.8 oz (101.1 kg)   BMI 35.96 kg/m    Subjective:    Patient ID: Nancy Chambers, female    DOB: 08-09-1955, 61 y.o.   MRN: [id number]  HPI: Nancy Chambers is a 61 y.o. female presenting on 03/15/2016 for Follow-up (Follow up on celexa- patient states that it made her ill and she stopped taking) Patient comes in for recheck on her medication. She only took Celexa just a couple of days. She felt quite ill tempered while taking it. She states she even cursed some. She does not normally do this at all. She has had multiple other medications that she has had side effects for these are all documented in her allergies/intolerance list. They do include Wellbutrin, Prozac which caused rash, Zoloft and Paxil cause weight gain, and Cymbalta caused stomach and kidney pain and a choking sensation. She states she has seen Dr. Jennelle Human in the past. He was one that tried the Wellbutrin.  Relevant past medical, surgical, family and social history reviewed and updated as indicated. Allergies and medications reviewed and updated.  Past Medical History:  Diagnosis Date  . Allergy   . Anxiety   . Depression     Past Surgical History:  Procedure Laterality Date  . Broken nose surg    . DILATION AND CURETTAGE OF UTERUS    . VARICOSE VEIN SURGERY      Review of Systems  Constitutional: Negative.   HENT: Negative.   Eyes: Negative.   Respiratory: Negative.   Gastrointestinal: Negative.   Genitourinary: Negative.   Psychiatric/Behavioral: Positive for dysphoric mood. Negative for agitation, decreased concentration, sleep disturbance and suicidal ideas. The patient is not hyperactive.     Allergies as of 03/15/2016      Reactions   Cymbalta [duloxetine Hcl] Nausea And Vomiting   Stomach pain, kidney pain, choking   Paxil [paroxetine Hcl]    Weight gain   Wellbutrin [bupropion]    Feels too flirty   Zoloft [sertraline  Hcl]    Weight gain   Prozac [fluoxetine Hcl] Rash      Medication List       Accurate as of 03/15/16  1:00 PM. Always use your most recent med list.          GREEN TEA EXTRACT PO Take by mouth.   multivitamin tablet Take 1 tablet by mouth daily.   naproxen 500 MG tablet Commonly known as:  NAPROSYN Take 1 tablet (500 mg total) by mouth 2 (two) times daily with a meal.   VITAMIN B 12 PO Take by mouth.   VITAMIN D PO Take by mouth.   vortioxetine HBr 10 MG Tabs Commonly known as:  TRINTELLIX Take 1 tablet (10 mg total) by mouth daily.          Objective:    BP 97/69   Pulse 81   Temp 97.6 F (36.4 C) (Oral)   Ht 5\' 6"  (1.676 m)   Wt 222 lb 12.8 oz (101.1 kg)   BMI 35.96 kg/m   Allergies  Allergen Reactions  . Cymbalta [Duloxetine Hcl] Nausea And Vomiting    Stomach pain, kidney pain, choking  . Paxil [Paroxetine Hcl]     Weight gain  . Wellbutrin [Bupropion]     Feels too flirty  . Zoloft [Sertraline Hcl]     Weight  gain  . Prozac [Fluoxetine Hcl] Rash    Physical Exam  Constitutional: She is oriented to person, place, and time. She appears well-developed and well-nourished.  HENT:  Head: Normocephalic and atraumatic.  Eyes: Conjunctivae and EOM are normal. Pupils are equal, round, and reactive to light.  Cardiovascular: Normal rate, regular rhythm, normal heart sounds and intact distal pulses.   Pulmonary/Chest: Effort normal and breath sounds normal.  Abdominal: Soft. Bowel sounds are normal.  Neurological: She is alert and oriented to person, place, and time. She has normal reflexes.  Skin: Skin is warm and dry. No rash noted.  Psychiatric: She has a normal mood and affect. Her behavior is normal. Judgment and thought content normal.   Depression screen [facility] 2/9 03/15/2016 03/15/2016 02/24/2016  Decreased Interest 1 1 2   Down, Depressed, Hopeless 1 1 2   PHQ - 2 Score 2 2 4   Altered sleeping 0 0 1  Tired, decreased energy 3 3 2   Change in  appetite 3 3 2   Feeling bad or failure about yourself  3 3 1   Trouble concentrating 0 0 3  Moving slowly or fidgety/restless 0 0 2  Suicidal thoughts 0 0 1  PHQ-9 Score 11 11 16   Difficult doing work/chores Somewhat difficult Somewhat difficult -           Assessment & Plan:   1. Depression, major, single episode, mild (HCC) - vortioxetine HBr (TRINTELLIX) 10 MG TABS; Take 1 tablet (10 mg total) by mouth daily.  Dispense: 30 tablet; Refill: 1   Continue all other maintenance medications as listed above.  Follow up plan: Return in about 4 weeks (around 04/12/2016) for recheck.  No orders of the defined types were placed in this encounter.   Educational handout given for depression  Remus LofflerAngel S. Pallavi Clifton PA-C [facility] [address]  [place], [place] [address] [phone]   03/15/2016, 1:00 PM

## 2016-03-24 ENCOUNTER — Ambulatory Visit: Payer: 59 | Admitting: Physician Assistant

## 2016-04-12 ENCOUNTER — Ambulatory Visit (INDEPENDENT_AMBULATORY_CARE_PROVIDER_SITE_OTHER): Payer: 59 | Admitting: Physician Assistant

## 2016-04-12 ENCOUNTER — Encounter: Payer: Self-pay | Admitting: Physician Assistant

## 2016-04-12 ENCOUNTER — Ambulatory Visit: Payer: 59 | Admitting: Physician Assistant

## 2016-04-12 VITALS — BP 95/60 | HR 84 | Temp 98.0°F | Ht 66.0 in | Wt 225.6 lb

## 2016-04-12 DIAGNOSIS — F339 Major depressive disorder, recurrent, unspecified: Secondary | ICD-10-CM

## 2016-04-12 MED ORDER — BUPROPION HCL ER (XL) 150 MG PO TB24: 150.0000 mg | ORAL_TABLET | Freq: Every day | ORAL | 1 refills | Status: DC

## 2016-04-12 NOTE — Patient Instructions (Signed)

## 2016-04-13 NOTE — Progress Notes (Signed)
BP 95/60   Pulse 84   Temp 98 F (36.7 C) (Oral)   Ht 5\' 6"  (1.676 m)   Wt 225 lb 9.6 oz (102.3 kg)   BMI 36.41 kg/m    Subjective:    Patient ID: Nancy Chambers, female    DOB: [birth year], 61 y.o.   MRN: [id number]  HPI: Nancy Chambers is a 61 y.o. female presenting on 04/12/2016 for Follow-up (Depression-Trintellex-Patient stopped medication she states it caused her to vomit)  This patient comes in for periodic recheck on medications and conditions including depression and medications. The patient reports that conflicts caused her to vomit after taking a dose. It is of note she has tried Prozac caused a rash, Zoloft weight gain, Paxil weight gain, Cymbalta upset stomach and choking, Celexa will, Wellbutrin and felt flirty. We have had a long discussion about the limited medications that we have for her. She is highly concerned about her weight and concentration. We have discussed how she felt on Wellbutrin. She states that while she was on it she did have a great improvement in her mood and concentration was good. I've also let her know that it can help in weight loss efforts by reducing cravings related to stress eating. She is willing to try the medications again.   All of the medical conditions are reviewed and updated.  Lab work is reviewed and will be ordered as medically necessary. There are no new problems reported with today's visit.  Relevant past medical, surgical, family and social history reviewed and updated as indicated. Allergies and medications reviewed and updated.  Past Medical History:  Diagnosis Date  . Allergy   . Anxiety   . Depression     Past Surgical History:  Procedure Laterality Date  . Broken nose surg    . DILATION AND CURETTAGE OF UTERUS    . VARICOSE VEIN SURGERY      Review of Systems  Constitutional: Negative.   HENT: Negative.   Eyes: Negative.   Respiratory: Negative.   Gastrointestinal: Negative.   Genitourinary: Negative.     Psychiatric/Behavioral: Positive for decreased concentration and dysphoric mood. Negative for self-injury, sleep disturbance and suicidal ideas. The patient is nervous/anxious.     Allergies as of 04/12/2016      Reactions   Cymbalta [duloxetine Hcl] Nausea And Vomiting   Stomach pain, kidney pain, choking   Paxil [paroxetine Hcl]    Weight gain   Wellbutrin [bupropion]    Feels too flirty   Zoloft [sertraline Hcl]    Weight gain   Prozac [fluoxetine Hcl] Rash      Medication List       Accurate as of 04/12/16 11:59 PM. Always use your most recent med list.          buPROPion 150 MG 24 hr tablet Commonly known as:  WELLBUTRIN XL Take 1 tablet (150 mg total) by mouth daily.   GREEN TEA EXTRACT PO Take by mouth.   multivitamin tablet Take 1 tablet by mouth daily.   naproxen 500 MG tablet Commonly known as:  NAPROSYN Take 1 tablet (500 mg total) by mouth 2 (two) times daily with a meal.   VITAMIN B 12 PO Take by mouth.   VITAMIN D PO Take by mouth.          Objective:    BP 95/60   Pulse 84   Temp 98 F (36.7 C) (Oral)   Ht 5\' 6"  (1.676 m)   Wt  225 lb 9.6 oz (102.3 kg)   BMI 36.41 kg/m   Allergies  Allergen Reactions  . Cymbalta [Duloxetine Hcl] Nausea And Vomiting    Stomach pain, kidney pain, choking  . Paxil [Paroxetine Hcl]     Weight gain  . Wellbutrin [Bupropion]     Feels too flirty  . Zoloft [Sertraline Hcl]     Weight gain  . Prozac [Fluoxetine Hcl] Rash    Physical Exam  Constitutional: She is oriented to person, place, and time. She appears well-developed and well-nourished.  HENT:  Head: Normocephalic and atraumatic.  Eyes: Conjunctivae and EOM are normal. Pupils are equal, round, and reactive to light.  Cardiovascular: Normal rate, regular rhythm, normal heart sounds and intact distal pulses.   Pulmonary/Chest: Effort normal and breath sounds normal.  Abdominal: Soft. Bowel sounds are normal.  Neurological: She is alert and  oriented to person, place, and time. She has normal reflexes.  Skin: Skin is warm and dry. No rash noted.  Psychiatric: She has a normal mood and affect. Her behavior is normal. Judgment and thought content normal.        Assessment & Plan:   1. Depression, recurrent (HCC) - buPROPion (WELLBUTRIN XL) 150 MG 24 hr tablet; Take 1 tablet (150 mg total) by mouth daily.  Dispense: 30 tablet; Refill: 1   Continue all other maintenance medications as listed above.  Follow up plan: Return in about 4 weeks (around 05/10/2016) for meds.  Educational handout given for major depression  Remus LofflerAngel S. Desire Fulp PA-C [facility] [address]  [place], [place] [address] [phone]   04/13/2016, 9:18 AM

## 2016-05-19 ENCOUNTER — Ambulatory Visit: Payer: 59 | Admitting: Physician Assistant

## 2016-06-01 ENCOUNTER — Ambulatory Visit (INDEPENDENT_AMBULATORY_CARE_PROVIDER_SITE_OTHER): Payer: 59 | Admitting: Gynecology

## 2016-06-01 ENCOUNTER — Encounter: Payer: Self-pay | Admitting: Gynecology

## 2016-06-01 VITALS — BP 114/70 | Ht 66.0 in | Wt 226.0 lb

## 2016-06-01 DIAGNOSIS — Z01411 Encounter for gynecological examination (general) (routine) with abnormal findings: Secondary | ICD-10-CM

## 2016-06-01 DIAGNOSIS — N952 Postmenopausal atrophic vaginitis: Secondary | ICD-10-CM | POA: Diagnosis not present

## 2016-06-01 NOTE — Patient Instructions (Signed)
Schedule your mammogram.  Follow up in one year for your annual exam

## 2016-06-01 NOTE — Progress Notes (Signed)
    JAELYN BOURGOIN 1955-05-05 [id number]        61 y.o.  G1P1001 for annual exam.    Past medical history,surgical history, problem list, medications, allergies, family history and social history were all reviewed and documented as reviewed in the EPIC chart.  ROS:  Performed with pertinent positives and negatives included in the history, assessment and plan.   Additional significant findings :  None   Exam: Kennon Portela assistant Vitals:   06/01/16 1357  BP: 114/70  Weight: 226 lb (102.5 kg)  Height:  (1.676 m)   Body mass index is 36.48 kg/m.  General appearance:  Normal affect, orientation and appearance. Skin: Grossly normal HEENT: Without gross lesions.  No cervical or supraclavicular adenopathy. Thyroid normal.  Lungs:  Clear without wheezing, rales or rhonchi Cardiac: RR, without RMG Abdominal:  Soft, nontender, without masses, guarding, rebound, organomegaly or hernia Breasts:  Examined lying and sitting without masses, retractions, discharge or axillary adenopathy. Pelvic:  Ext, BUS, Vagina: With atrophic changes  Cervix: With atrophic changes  Uterus: Anteverted, normal size, shape and contour, midline and mobile nontender   Adnexa: Without masses or tenderness    Anus and perineum: Normal   Rectovaginal: Normal sphincter tone without palpated masses or tenderness.    Assessment/Plan:  61 y.o. G47P1001 female for annual exam.   1. Postmenopausal/atrophic genital changes. No significant hot flushes, night sweats, vaginal dryness and no vaginal bleeding. Continue to monitor report any issues or bleeding. 2. Pap smear/HPV 08/2012 negative. No Pap smear done today. No history of significant abnormal Pap smears. Plan repeat Pap smear next year at 5 year interval per current screening guidelines. 3. Mammography due now and I reminded patient to call and schedule and she agrees to do so. SBE monthly reviewed. 4. DEXA 2015 normal. Plan repeat DEXA at age 52. Increased  calcium vitamin D reviewed. 5. Colonoscopy coming due next year. Patients going to make these arrangements when due. 6. Health maintenance. No routine lab work done as patient reports this recently done for insurance purposes. Follow up in one year, sooner as needed.   Dara Lords MD, 2:16 PM 06/01/2016

## 2017-01-10 ENCOUNTER — Encounter: Payer: Self-pay | Admitting: Endocrinology

## 2017-01-10 ENCOUNTER — Ambulatory Visit (INDEPENDENT_AMBULATORY_CARE_PROVIDER_SITE_OTHER): Payer: 59 | Admitting: Endocrinology

## 2017-01-10 DIAGNOSIS — E039 Hypothyroidism, unspecified: Secondary | ICD-10-CM

## 2017-01-10 MED ORDER — LEVOTHYROXINE SODIUM 25 MCG PO TABS: 25.0000 ug | ORAL_TABLET | Freq: Every day | ORAL | 3 refills | Status: DC

## 2017-01-10 NOTE — Progress Notes (Signed)
Subjective:    Patient ID: Nancy Chambers, female    DOB: 12/20/1955, 61 y.o.   MRN: [id number]  HPI Pt is referred by Dr Charm BargesButler, for hypothyroidism.  Pt reports hypothyroidism was dx'ed in mid-2018.  she has never taken kelp or any other type of non-prescribed thyroid product.  she has never had thyroid imaging.  She has never had thyroid surgery, or XRT to the neck.  she has never been on amiodarone or lithium.  She was rx'ed synthroid 25/d.  Due to slightly high TSH, it was increased to 50 mcg/d. Since then, she feels no different.  On this, she developed "sensation" in the ant neck, and assoc anxiety.  Therefore, she stopped it altogether 1-2 mos ago.   Past Medical History:  Diagnosis Date  . Allergy   . Anxiety   . Depression     Past Surgical History:  Procedure Laterality Date  . Broken nose surg    . DILATION AND CURETTAGE OF UTERUS    . VARICOSE VEIN SURGERY      Social History   Socioeconomic History  . Marital status: Married    Spouse name: Not on file  . Number of children: Not on file  . Years of education: Not on file  . Highest education level: Not on file  Social Needs  . Financial resource strain: Not on file  . Food insecurity - worry: Not on file  . Food insecurity - inability: Not on file  . Transportation needs - medical: Not on file  . Transportation needs - non-medical: Not on file  Occupational History  . Not on file  Tobacco Use  . Smoking status: Former Games developermoker  . Smokeless tobacco: Never Used  Substance and Sexual Activity  . Alcohol use: No  . Drug use: No  . Sexual activity: Not Currently    Birth control/protection: Post-menopausal    Comment: Declined sexual hx ques  Other Topics Concern  . Not on file  Social History Narrative  . Not on file    Current Outpatient Medications on File Prior to Visit  Medication Sig Dispense Refill  . buPROPion (WELLBUTRIN XL) 150 MG 24 hr tablet Take 1 tablet (150 mg total) by mouth daily. 30 tablet 1   . Cholecalciferol (VITAMIN D PO) Take 1,000 Units by mouth.     . Multiple Vitamin (MULTIVITAMIN) tablet Take 1 tablet by mouth daily.    . Cyanocobalamin (VITAMIN B 12 PO) Take by mouth.    Chilton Si. Green Tea, Camillia sinensis, (GREEN TEA EXTRACT PO) Take by mouth.     No current facility-administered medications on file prior to visit.     Allergies  Allergen Reactions  . Cymbalta [Duloxetine Hcl] Nausea And Vomiting    Stomach pain, kidney pain, choking  . Paxil [Paroxetine Hcl]     Weight gain  . Wellbutrin [Bupropion]     Feels too flirty  . Zoloft [Sertraline Hcl]     Weight gain  . Prozac [Fluoxetine Hcl] Rash    Family History  Problem Relation Age of Onset  . Pancreatic cancer Mother   . Liver cancer Mother   . Stroke Mother   . Hypertension Mother   . Heart attack Mother   . Heart attack Father   . Lung cancer Sister   . Thyroid disease Neg Hx     BP 112/68 (BP Location: Left Arm, Patient Position: Sitting, Cuff Size: Normal)   Pulse 79   Wt 225 lb  6.4 oz (102.2 kg)   SpO2 96%   BMI 36.38 kg/m   Review of Systems denies muscle cramps, numbness, blurry vision, rhinorrhea, easy bruising, and syncope.  She has hair loss, depression, dry skin, cold intolerance, back pain, itching, myalgias, constipation, weight gain, fatigue, difficulty with concentration, and doe.      Objective:   Physical Exam VS: see vs page GEN: no distress HEAD: head: no deformity eyes: no periorbital swelling, no proptosis external nose and ears are normal mouth: no lesion seen NECK: supple, thyroid is not enlarged CHEST WALL: no deformity LUNGS: clear to auscultation CV: reg rate and rhythm, no murmur.  ABD: abdomen is soft, nontender.  no hepatosplenomegaly.  not distended.  no hernia.  MUSCULOSKELETAL: muscle bulk and strength are grossly normal.  no obvious joint swelling.  gait is normal and steady.  EXTEMITIES: no deformity.  no ulcer on the feet.  feet are of normal color and  temp.  Trace bilat leg edema.  PULSES: dorsalis pedis intact bilat.  no carotid bruit NEURO:  cn 2-12 grossly intact.   readily moves all 4's.  sensation is intact to touch on the feet.  SKIN:  Normal texture and temperature.  No rash or suspicious lesion is visible.   NODES:  None palpable at the neck PSYCH: alert, well-oriented.  Does not appear anxious nor depressed.   I have reviewed outside records, and summarized: Pt was noted to have elevated TSH, and referred here.  Main problem addressed was depression/anxiety.  outside test results are reviewed: TSH=6     Assessment & Plan:  Hypothyroidism, new, usually due to autoimmune thyroiditis.   Neck "sensation," not medication-related.    Patient Instructions  I have sent a prescription to your pharmacy, to resume 25 mcg/day. Please come back for a follow-up blood test in 1 month.  You don't have to be fasting. If the result says we can, we'll try increasing it back to 50 mcg/d, only if you want to try again.   I would be happy to see you back here as needed.

## 2017-01-10 NOTE — Patient Instructions (Signed)
I have sent a prescription to your pharmacy, to resume 25 mcg/day. Please come back for a follow-up blood test in 1 month.  You don't have to be fasting. If the result says we can, we'll try increasing it back to 50 mcg/d, only if you want to try again.   I would be happy to see you back here as needed.

## 2017-02-10 ENCOUNTER — Other Ambulatory Visit (INDEPENDENT_AMBULATORY_CARE_PROVIDER_SITE_OTHER): Payer: BLUE CROSS/BLUE SHIELD

## 2017-02-10 DIAGNOSIS — E039 Hypothyroidism, unspecified: Secondary | ICD-10-CM | POA: Diagnosis not present

## 2017-02-10 LAB — TSH: TSH: 2.83 u[IU]/mL (ref 0.35–4.50)

## 2017-02-10 LAB — T4, FREE: Free T4: 0.82 ng/dL (ref 0.60–1.60)

## 2017-03-01 ENCOUNTER — Telehealth: Payer: Self-pay | Admitting: Endocrinology

## 2017-03-01 NOTE — Telephone Encounter (Signed)
levothyroxine (SYNTHROID, LEVOTHROID) 25 MCG tablet  Pt stated that medication is making her feet swell. And she's irritable.   Please advise.

## 2017-03-02 NOTE — Telephone Encounter (Signed)
OK, why don't you try taking 1/2 pill per day for a while?

## 2017-03-02 NOTE — Telephone Encounter (Signed)
Patient stated that she would take the half pill & call us back to let us know how she was feeling.

## 2017-06-28 ENCOUNTER — Telehealth: Payer: Self-pay | Admitting: Endocrinology

## 2017-06-28 DIAGNOSIS — E039 Hypothyroidism, unspecified: Secondary | ICD-10-CM

## 2017-06-28 NOTE — Telephone Encounter (Signed)
Please advise 

## 2017-06-28 NOTE — Telephone Encounter (Signed)
Patient has been taking 1/2 tablet of thyroid medication since her ankle started swelling. Please call patient at ph# [phone] to advise - is the dosage correct? Does blood work need to be done to check thyroid levels?

## 2017-06-28 NOTE — Telephone Encounter (Signed)
I ordered labs  -

## 2017-07-18 ENCOUNTER — Telehealth: Payer: Self-pay | Admitting: Endocrinology

## 2017-07-18 NOTE — Telephone Encounter (Signed)
Already ordered

## 2017-07-18 NOTE — Telephone Encounter (Signed)
Been taking Synthroid 2 months since med dosage has been changed to 1/2 tablet 25 mcg tabs want to know if it is time to get her labs drawn.  Please advise

## 2017-07-18 NOTE — Telephone Encounter (Signed)
I have called & made lab appointment for patient Wednesday at 3:45.

## 2017-07-20 ENCOUNTER — Other Ambulatory Visit (INDEPENDENT_AMBULATORY_CARE_PROVIDER_SITE_OTHER): Payer: BLUE CROSS/BLUE SHIELD

## 2017-07-20 DIAGNOSIS — E039 Hypothyroidism, unspecified: Secondary | ICD-10-CM

## 2017-07-20 LAB — TSH: TSH: 3.22 u[IU]/mL (ref 0.35–4.50)

## 2017-07-20 LAB — T4, FREE: Free T4: 0.87 ng/dL (ref 0.60–1.60)

## 2017-07-25 ENCOUNTER — Telehealth: Payer: Self-pay | Admitting: Endocrinology

## 2017-07-25 NOTE — Telephone Encounter (Signed)
Patient stated she is returning your call.  

## 2017-07-25 NOTE — Telephone Encounter (Signed)
I see we have patient's labs back, but they have not yet been released?

## 2017-07-25 NOTE — Telephone Encounter (Signed)
Patient would like to know the results of her labs,. Please advise

## 2017-07-25 NOTE — Telephone Encounter (Signed)
I LVM for patient & stated labs had not yet been released. I said that when they were they would be sent to her my chart or Dr. Everardo AllEllison would message me. I also sent a message to Dr. Everardo AllEllison.

## 2017-07-26 NOTE — Telephone Encounter (Signed)
Notified patient of normal results & that labs had been released to my chart.

## 2017-07-26 NOTE — Telephone Encounter (Signed)
I released

## 2017-10-25 ENCOUNTER — Encounter: Payer: Self-pay | Admitting: Gynecology

## 2017-10-25 ENCOUNTER — Ambulatory Visit (INDEPENDENT_AMBULATORY_CARE_PROVIDER_SITE_OTHER): Payer: BLUE CROSS/BLUE SHIELD | Admitting: Gynecology

## 2017-10-25 VITALS — BP 124/80 | Ht 66.0 in | Wt 223.0 lb

## 2017-10-25 DIAGNOSIS — Z01419 Encounter for gynecological examination (general) (routine) without abnormal findings: Secondary | ICD-10-CM

## 2017-10-25 DIAGNOSIS — Z1151 Encounter for screening for human papillomavirus (HPV): Secondary | ICD-10-CM | POA: Diagnosis not present

## 2017-10-25 DIAGNOSIS — N952 Postmenopausal atrophic vaginitis: Secondary | ICD-10-CM

## 2017-10-25 NOTE — Patient Instructions (Signed)
Follow-up in 1 year for annual exam, sooner if any issues. 

## 2017-10-25 NOTE — Progress Notes (Signed)
    Nancy Chambers 04/13/1955 [id number]        62 y.o.  G1P1001 for annual gynecologic exam.  Without gynecologic complaints.  Past medical history,surgical history, problem list, medications, allergies, family history and social history were all reviewed and documented as reviewed in the EPIC chart.  ROS:  Performed with pertinent positives and negatives included in the history, assessment and plan.   Additional significant findings : None   Exam: Kennon PortelaKim Gardner assistant Vitals:   10/25/17 1403  BP: 124/80  Weight: 223 lb (101.2 kg)  Height: 5\' 6"  (1.676 m)   Body mass index is 35.99 kg/m.  General appearance:  Normal affect, orientation and appearance. Skin: Grossly normal HEENT: Without gross lesions.  No cervical or supraclavicular adenopathy. Thyroid normal.  Lungs:  Clear without wheezing, rales or rhonchi Cardiac: RR, without RMG Abdominal:  Soft, nontender, without masses, guarding, rebound, organomegaly or hernia Breasts:  Examined lying and sitting without masses, retractions, discharge or axillary adenopathy. Pelvic:  Ext, BUS, Vagina: Normal with atrophic changes  Cervix: Normal with atrophic changes.  Pap smear/HPV  Uterus: Anteverted, normal size, shape and contour, midline and mobile nontender   Adnexa: Without masses or tenderness    Anus and perineum: Normal   Rectovaginal: Normal sphincter tone without palpated masses or tenderness.    Assessment/Plan:  62 y.o. [name] female for annual gynecologic exam.   1. Postmenopausal/atrophic genital changes.  No significant menopausal symptoms or any bleeding. 2. Pap smear/HPV 2014.  Pap smear/HPV today.  No history of significant abnormal Pap smears previously. 3. Mammography scheduled next week.  Breast exam normal today. 4. Colonoscopy due this year and patient knows to call and schedule. 5. DEXA 2015 normal.  Plan repeat DEXA next year at 5-year interval. 6. Health maintenance.  No routine lab work done as  patient does this elsewhere.  Follow-up 1 year, sooner as needed.   Nancy Lordsimothy P Kanasia Gayman MD, 2:20 PM 10/25/2017

## 2017-10-25 NOTE — Addendum Note (Signed)
Addended by: Dayna BarkerGARDNER, KIMBERLY K on: 10/25/2017 02:36 PM   Modules accepted: Orders

## 2017-10-27 LAB — PAP IG AND HPV HIGH-RISK: HPV DNA HIGH RISK: NOT DETECTED

## 2018-01-18 ENCOUNTER — Other Ambulatory Visit: Payer: Self-pay | Admitting: Endocrinology

## 2018-02-10 ENCOUNTER — Other Ambulatory Visit: Payer: Self-pay | Admitting: Endocrinology

## 2018-04-06 ENCOUNTER — Telehealth: Payer: Self-pay | Admitting: Endocrinology

## 2018-04-06 NOTE — Telephone Encounter (Signed)
Patient would like a call back to discuss a thyroid medication that she has several questions about  Please advise

## 2018-04-06 NOTE — Telephone Encounter (Signed)
Returned pt call. Asked if levothyroxine caused "leaky gut" Education about medication provided. Recommended to discuss concerns re: "leaky gut" with PCP. Likely she may need referral to GI or general surgery to determine root cause and treatment. Verbalized acceptance and understanding.

## 2018-04-10 ENCOUNTER — Encounter: Payer: Self-pay | Admitting: Gynecology

## 2018-10-20 ENCOUNTER — Encounter: Payer: Self-pay | Admitting: Gastroenterology

## 2018-10-27 ENCOUNTER — Encounter: Payer: BLUE CROSS/BLUE SHIELD | Admitting: Gynecology

## 2018-11-03 ENCOUNTER — Encounter: Payer: BLUE CROSS/BLUE SHIELD | Admitting: Gynecology

## 2018-11-07 ENCOUNTER — Encounter: Payer: Self-pay | Admitting: Gynecology

## 2018-11-21 ENCOUNTER — Encounter: Payer: BLUE CROSS/BLUE SHIELD | Admitting: Gastroenterology

## 2018-11-28 ENCOUNTER — Encounter: Payer: Self-pay | Admitting: Internal Medicine

## 2019-12-21 ENCOUNTER — Ambulatory Visit (INDEPENDENT_AMBULATORY_CARE_PROVIDER_SITE_OTHER): Payer: 59 | Admitting: Obstetrics and Gynecology

## 2019-12-21 ENCOUNTER — Other Ambulatory Visit: Payer: Self-pay

## 2019-12-21 ENCOUNTER — Encounter: Payer: Self-pay | Admitting: Obstetrics and Gynecology

## 2019-12-21 VITALS — BP 124/82 | Ht 66.0 in | Wt 249.0 lb

## 2019-12-21 DIAGNOSIS — Z13820 Encounter for screening for osteoporosis: Secondary | ICD-10-CM

## 2019-12-21 DIAGNOSIS — Z01419 Encounter for gynecological examination (general) (routine) without abnormal findings: Secondary | ICD-10-CM

## 2019-12-21 NOTE — Progress Notes (Signed)
   Nancy Chambers 04/06/1955 [id number]  SUBJECTIVE:  64 y.o. G24P1001 female for annual routine gynecologic exam. She has no gynecologic concerns.  Current Outpatient Medications  Medication Sig Dispense Refill  . Cholecalciferol (VITAMIN D PO) Take 1,000 Units by mouth.     . levothyroxine (SYNTHROID, LEVOTHROID) 25 MCG tablet TAKE 1 TABLET (25 MCG TOTAL) BY MOUTH DAILY BEFORE BREAKFAST. 30 tablet 0  . Multiple Vitamin (MULTIVITAMIN) tablet Take 1 tablet by mouth daily.     No current facility-administered medications for this visit.   Allergies: Cymbalta [duloxetine hcl], Paxil [paroxetine hcl], Wellbutrin [bupropion], Zoloft [sertraline hcl], and Prozac [fluoxetine hcl]  No LMP recorded. Patient is postmenopausal.  Past medical history,surgical history, problem list, medications, allergies, family history and social history were all reviewed and documented as reviewed in the EPIC chart.  ROS: Pertinent positives and negatives as reviewed in HPI   OBJECTIVE:  BP 124/82   Ht 5\' 6"  (1.676 m)   Wt 249 lb (112.9 kg)   BMI 40.19 kg/m  The patient appears well, alert, oriented, in no distress. ENT normal.  Neck supple. No cervical or supraclavicular adenopathy or thyromegaly.  Lungs are clear, good air entry, no wheezes, rhonchi or rales. S1 and S2 normal, no murmurs, regular rate and rhythm.  Abdomen soft without tenderness, guarding, mass or organomegaly.  Neurological is normal, no focal findings.  BREAST EXAM: breasts appear normal, no suspicious masses, no skin or nipple changes or axillary nodes  PELVIC EXAM: VULVA: normal appearing vulva with atrophic changes, no masses, tenderness or lesions, VAGINA: normal appearing vagina with atrophic change, normal color and discharge, no lesions, CERVIX: normal appearing cervix without discharge or lesions, UTERUS: uterus is normal size, shape, consistency and nontender, ADNEXA: normal adnexa in size, nontender and no masses  Chaperone: present during the examination  ASSESSMENT:  64 y.o. G1P1001 here for annual gynecologic exam  PLAN:   1. Postmenopausal.  No significant hot flashes or night sweats.  No vaginal bleeding. 2. Pap smear/HPV 10/2017.  No significant history of abnormal Pap smears.  Next Pap smear due 2025 following the current guidelines recommending the 5 year interval. 3. Mammogram 04/2018.  Normal breast exam today.  She is reminded to schedule an annual mammogram as this is overdue.   4. Colonoscopy 2006.  She is reminded to schedule colon cancer screening as soon as possible as this is way overdue.  Acknowledges my recommendation. 5. DEXA 2015 normal.   Next DEXA recommended and she was last year but that did not get done.  I highly encouraged her to move forward with scheduling this at checkout today to screen for osteoporosis. 6. Health maintenance.  No labs today as she normally has these completed elsewhere.  Return annually or sooner, prn.  2007 MD 12/21/19

## 2021-05-28 DIAGNOSIS — Z0000 Encounter for general adult medical examination without abnormal findings: Secondary | ICD-10-CM | POA: Diagnosis not present

## 2021-07-10 ENCOUNTER — Emergency Department (HOSPITAL_COMMUNITY): Payer: 59

## 2021-07-10 ENCOUNTER — Other Ambulatory Visit: Payer: Self-pay

## 2021-07-10 ENCOUNTER — Encounter (HOSPITAL_COMMUNITY): Payer: Self-pay | Admitting: *Deleted

## 2021-07-10 ENCOUNTER — Observation Stay (HOSPITAL_COMMUNITY)
Admission: EM | Admit: 2021-07-10 | Discharge: 2021-07-11 | Disposition: A | Discharge status: Home / Self Care | Payer: 59 | Attending: Internal Medicine | Admitting: Internal Medicine

## 2021-07-10 DIAGNOSIS — R61 Generalized hyperhidrosis: Secondary | ICD-10-CM | POA: Diagnosis not present

## 2021-07-10 DIAGNOSIS — Z79899 Other long term (current) drug therapy: Secondary | ICD-10-CM | POA: Insufficient documentation

## 2021-07-10 DIAGNOSIS — R531 Weakness: Principal | ICD-10-CM | POA: Insufficient documentation

## 2021-07-10 DIAGNOSIS — E785 Hyperlipidemia, unspecified: Secondary | ICD-10-CM | POA: Diagnosis not present

## 2021-07-10 DIAGNOSIS — Z87891 Personal history of nicotine dependence: Secondary | ICD-10-CM | POA: Insufficient documentation

## 2021-07-10 DIAGNOSIS — E039 Hypothyroidism, unspecified: Secondary | ICD-10-CM | POA: Insufficient documentation

## 2021-07-10 DIAGNOSIS — R29898 Other symptoms and signs involving the musculoskeletal system: Secondary | ICD-10-CM | POA: Diagnosis not present

## 2021-07-10 DIAGNOSIS — F320 Major depressive disorder, single episode, mild: Secondary | ICD-10-CM | POA: Diagnosis present

## 2021-07-10 LAB — CBC WITH DIFFERENTIAL/PLATELET
Abs Immature Granulocytes: 0.01 10*3/uL (ref 0.00–0.07)
Basophils Absolute: 0.1 10*3/uL (ref 0.0–0.1)
Basophils Relative: 1 %
Eosinophils Absolute: 0.2 10*3/uL (ref 0.0–0.5)
Eosinophils Relative: 5 %
HCT: 42.6 % (ref 36.0–46.0)
Hemoglobin: 13.9 g/dL (ref 12.0–15.0)
Immature Granulocytes: 0 %
Lymphocytes Relative: 28 %
Lymphs Abs: 1.2 10*3/uL (ref 0.7–4.0)
MCH: 30.8 pg (ref 26.0–34.0)
MCHC: 32.6 g/dL (ref 30.0–36.0)
MCV: 94.2 fL (ref 80.0–100.0)
Monocytes Absolute: 0.3 10*3/uL (ref 0.1–1.0)
Monocytes Relative: 7 %
Neutro Abs: 2.6 10*3/uL (ref 1.7–7.7)
Neutrophils Relative %: 59 %
Platelets: 241 10*3/uL (ref 150–400)
RBC: 4.52 MIL/uL (ref 3.87–5.11)
RDW: 13.9 % (ref 11.5–15.5)
WBC: 4.4 10*3/uL (ref 4.0–10.5)
nRBC: 0 % (ref 0.0–0.2)

## 2021-07-10 LAB — COMPREHENSIVE METABOLIC PANEL
ALT: 22 U/L (ref 0–44)
AST: 21 U/L (ref 15–41)
Albumin: 4.2 g/dL (ref 3.5–5.0)
Alkaline Phosphatase: 57 U/L (ref 38–126)
Anion gap: 5 (ref 5–15)
BUN: 14 mg/dL (ref 8–23)
CO2: 27 mmol/L (ref 22–32)
Calcium: 9 mg/dL (ref 8.9–10.3)
Chloride: 109 mmol/L (ref 98–111)
Creatinine, Ser: 1.28 mg/dL — ABNORMAL HIGH (ref 0.44–1.00)
GFR, Estimated: 47 mL/min — ABNORMAL LOW (ref >60)
Glucose, Bld: 103 mg/dL — ABNORMAL HIGH (ref 70–99)
Potassium: 3.6 mmol/L (ref 3.5–5.1)
Sodium: 141 mmol/L (ref 135–145)
Total Bilirubin: 0.6 mg/dL (ref 0.3–1.2)
Total Protein: 6.8 g/dL (ref 6.5–8.1)

## 2021-07-10 LAB — TROPONIN I (HIGH SENSITIVITY): Troponin I (High Sensitivity): <2 ng/L (ref <18)

## 2021-07-10 LAB — TSH: TSH: 2.619 u[IU]/mL (ref 0.350–4.500)

## 2021-07-10 IMAGING — DX DG CHEST 2V
2 series · 2 of 2 positions shown · non-contrast
Comparison: None Available.

CLINICAL DATA: Weakness

EXAM:
CHEST - 2 VIEW

[chest pa]
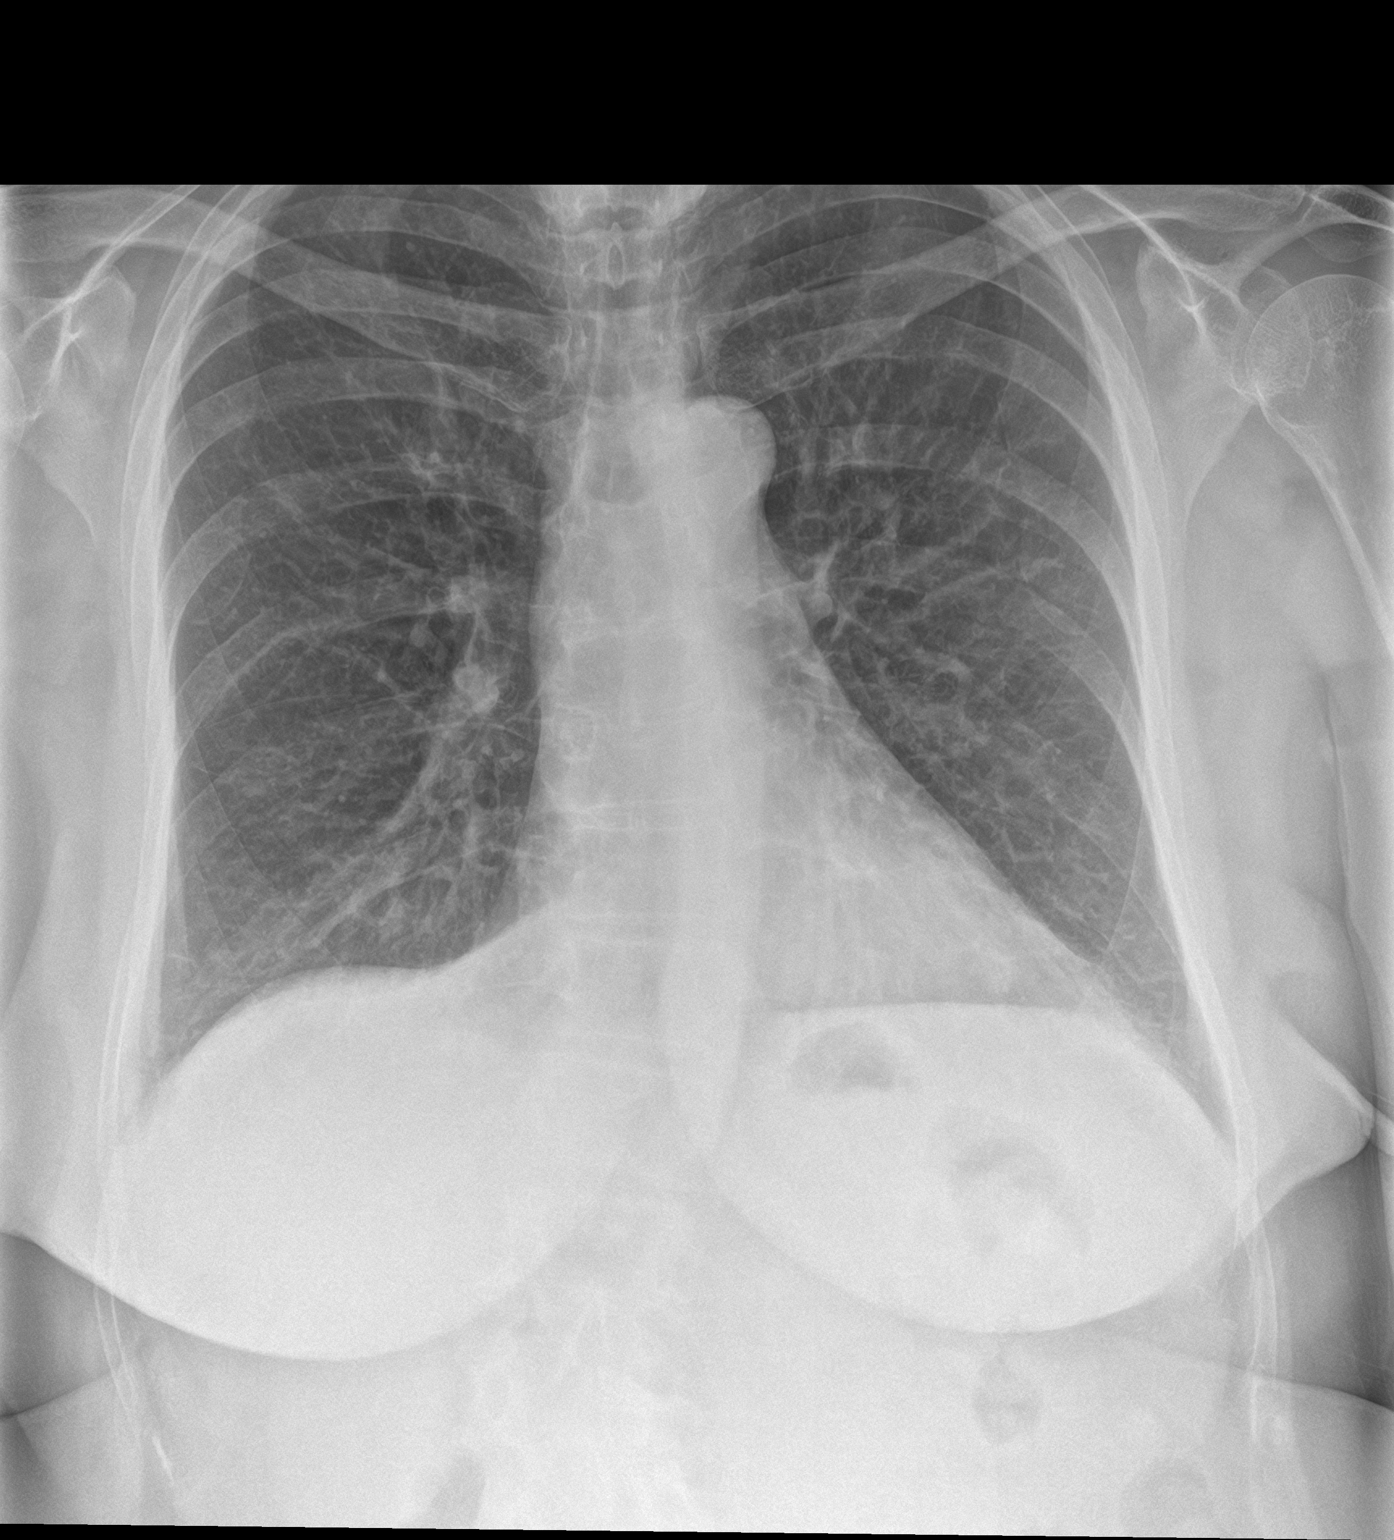

[chest lat]
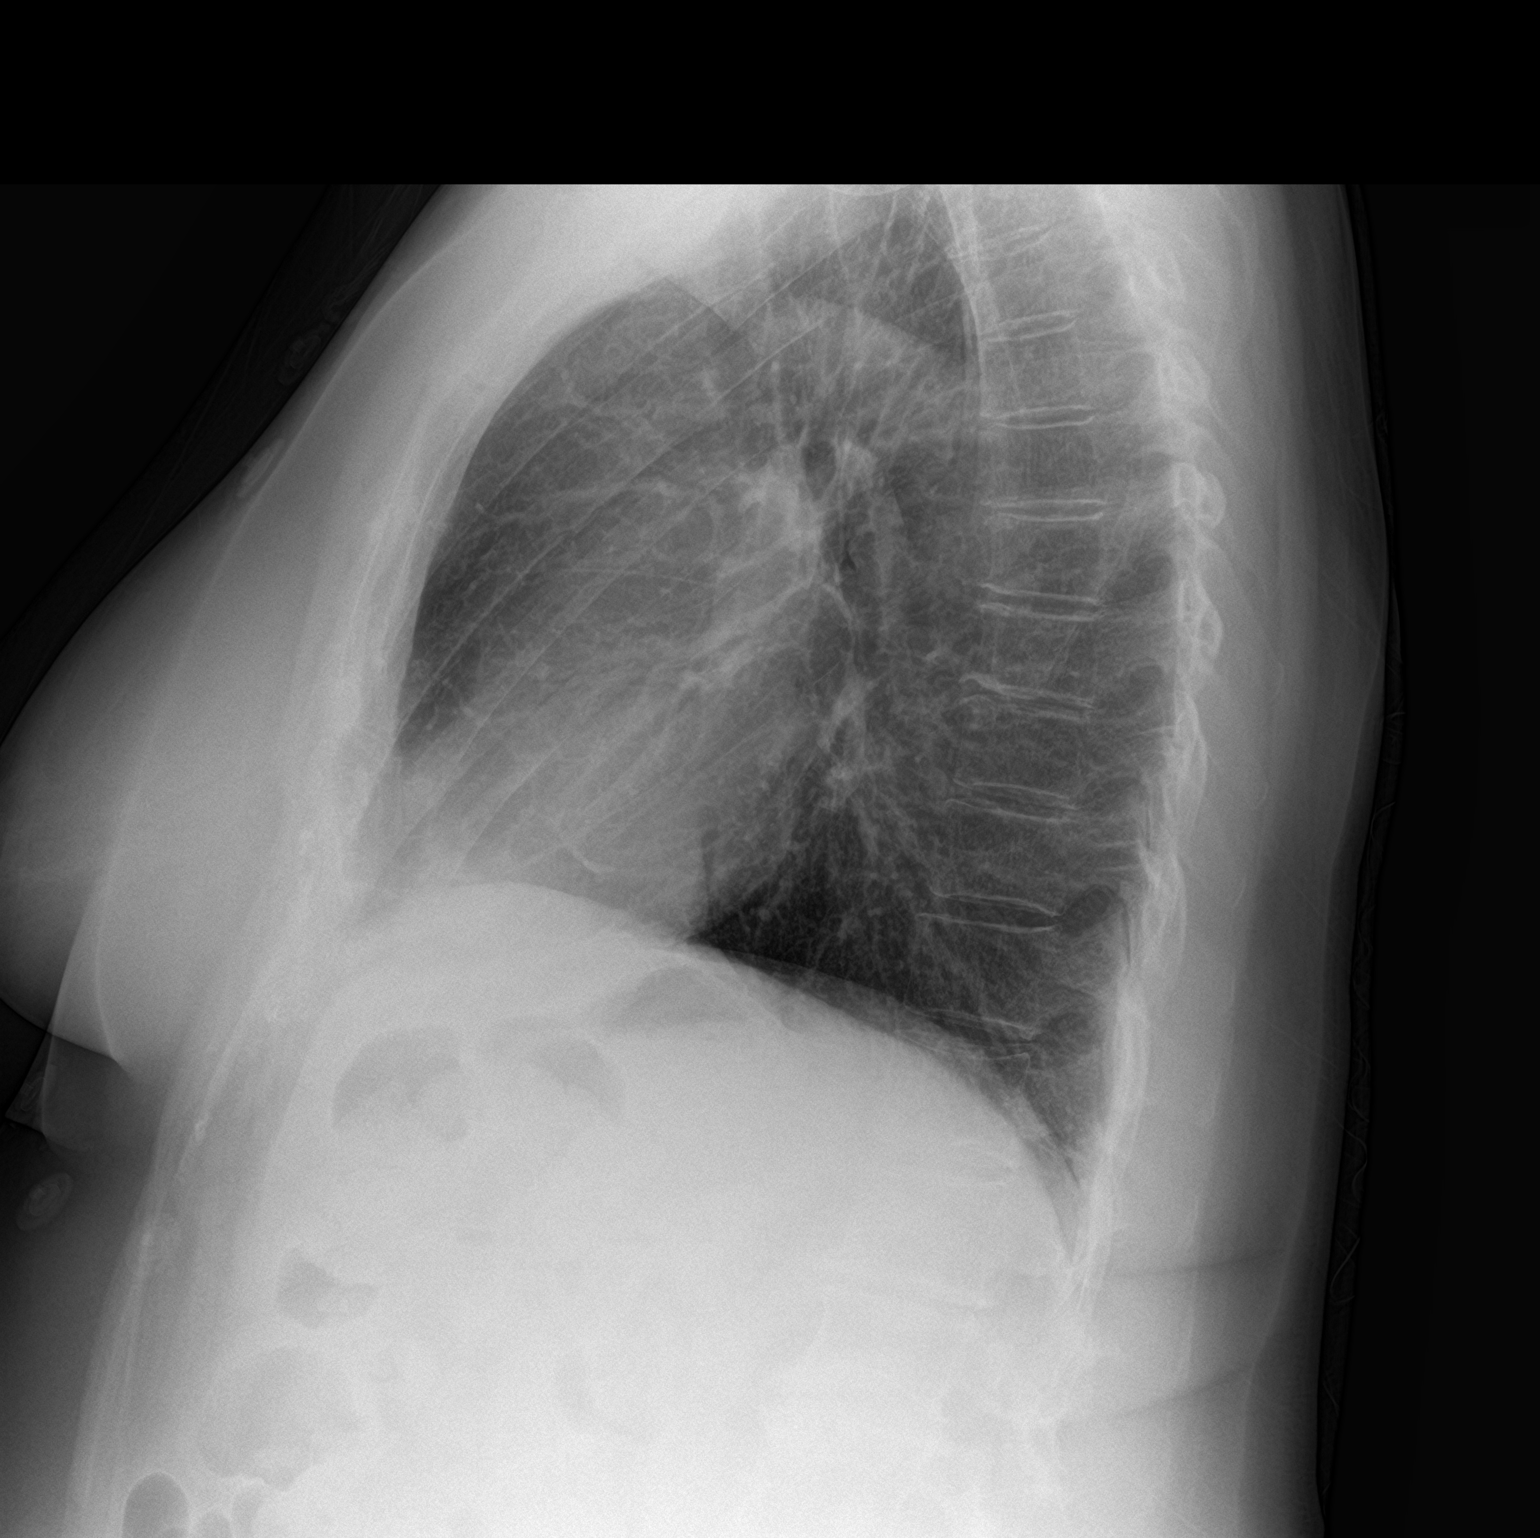

[2 of 2 positions shown; findings below may reference images not displayed]

FINDINGS: The heart size and mediastinal contours are within normal limits.
Both lungs are clear. The visualized skeletal structures are
unremarkable.
IMPRESSION: No active cardiopulmonary disease.

## 2021-07-10 IMAGING — CT CT HEAD W/O CM
3 of 4 series · 15 of 47 positions shown, 18 images · non-contrast
Comparison: CT head [DATE]

CLINICAL DATA: Diaphoresis, left arm heaviness



[Series 2: head w o · axial · 0.40mm/px · z∈[+10,+150]mm · 9 of 34 slices shown, 12 images]
[im 3/34  brain]
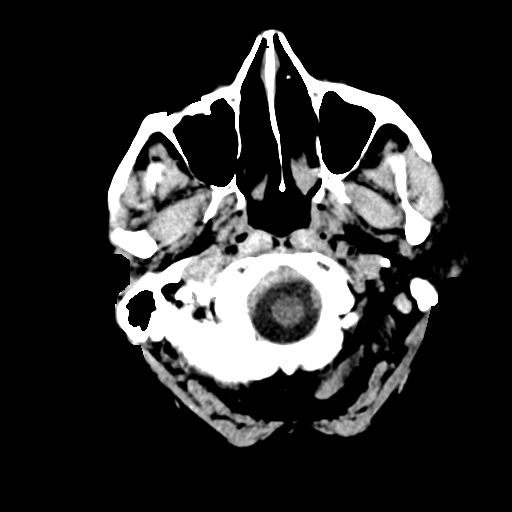
[im 3/34  bone]
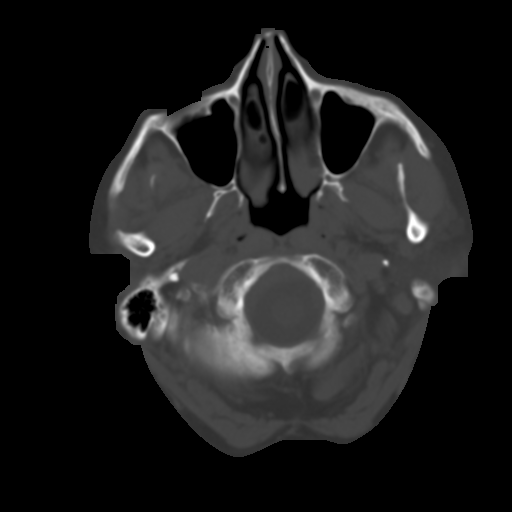
[im 8/34  brain]
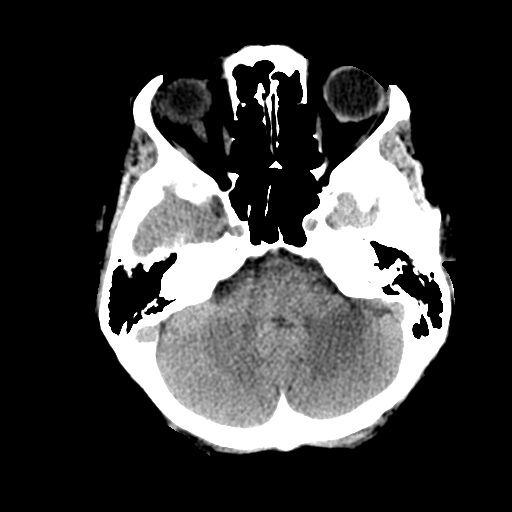
[im 10/34  brain]
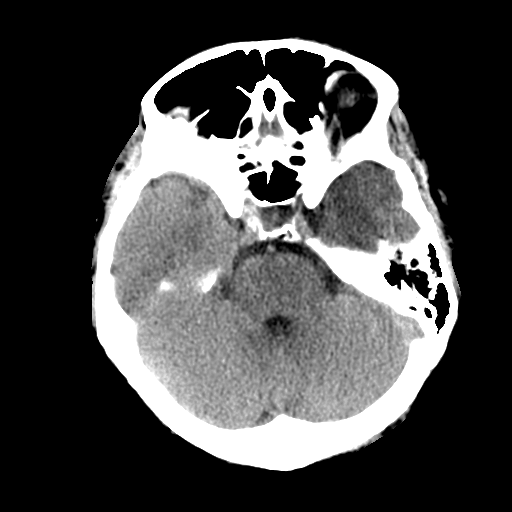
[im 15/34  brain]
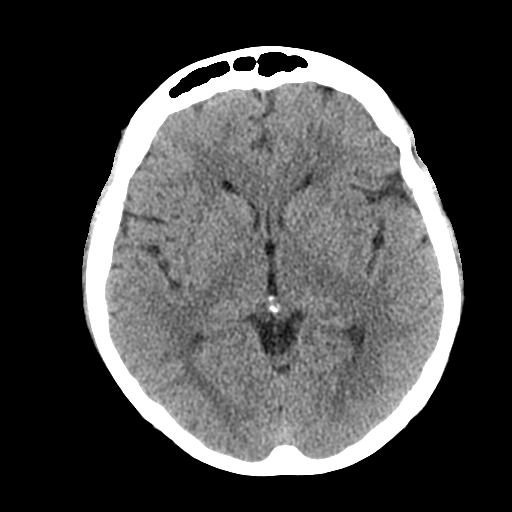
[im 17/34  brain]
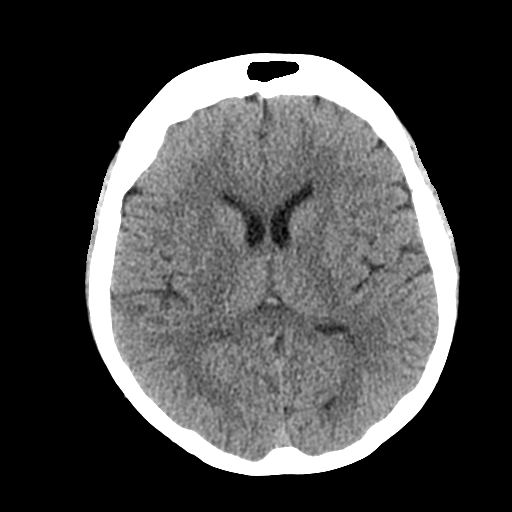
[im 17/34  bone]
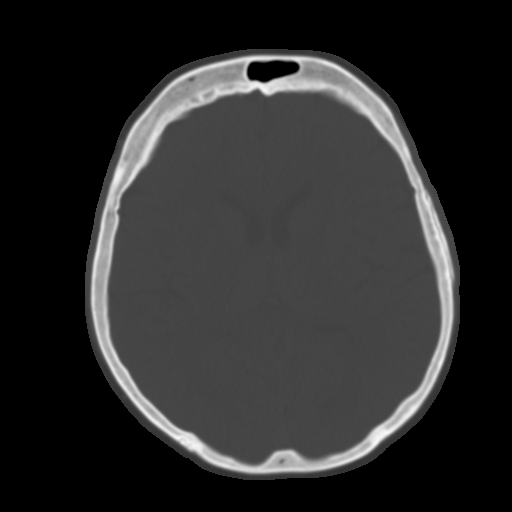
[im 19/34  brain]
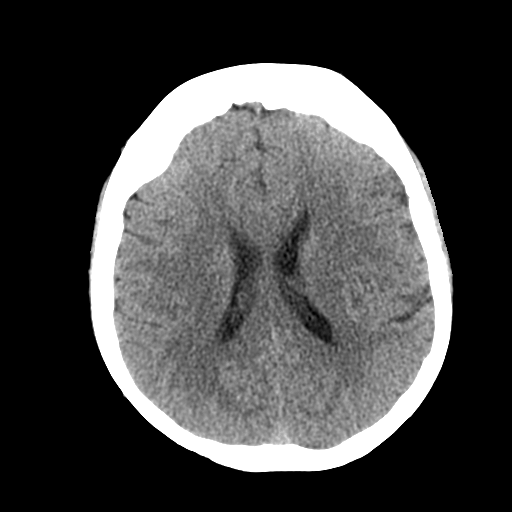
[im 24/34  brain]
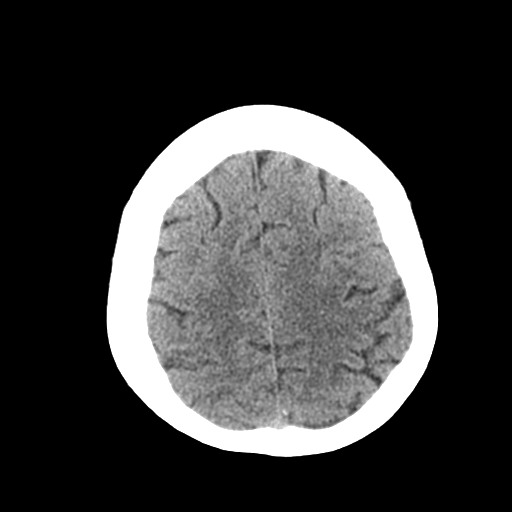
[im 26/34  brain]
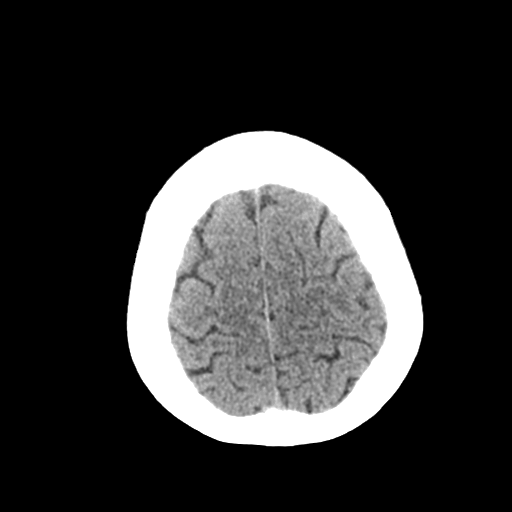
[im 31/34  brain]
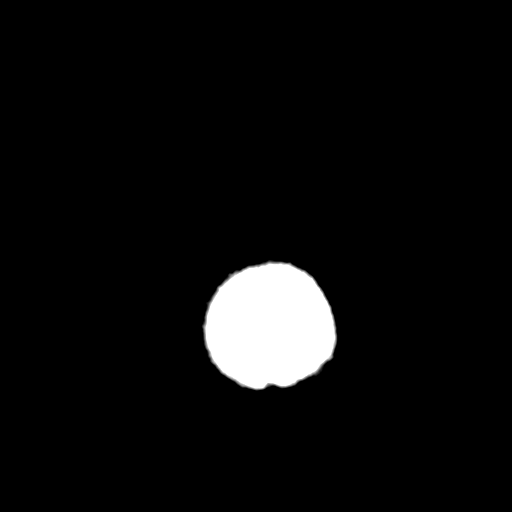
[im 31/34  bone]
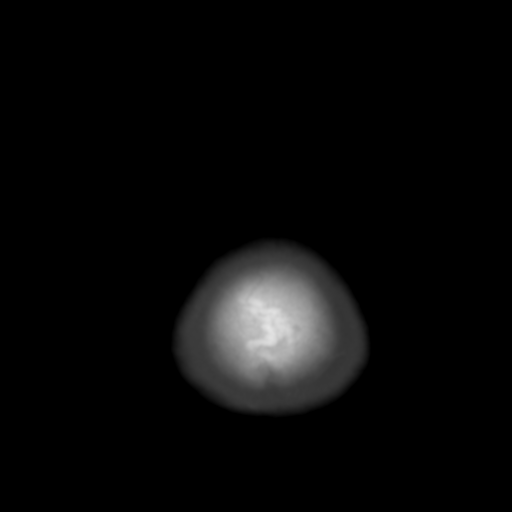

[Series 4: coronal soft · coronal · 0.32mm/px · 3 of 67 slices shown]
[im 23/67  brain]
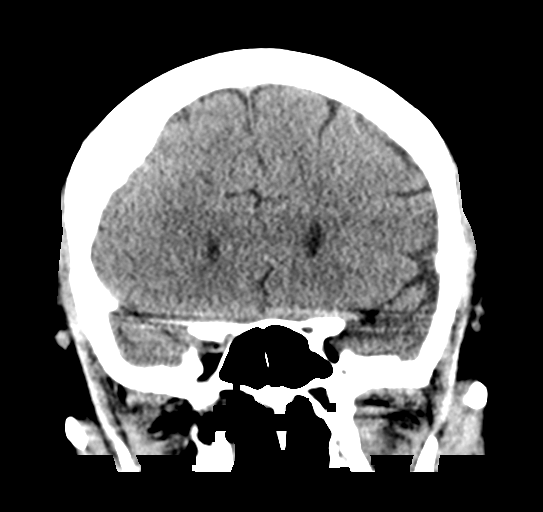
[im 30/67  brain]
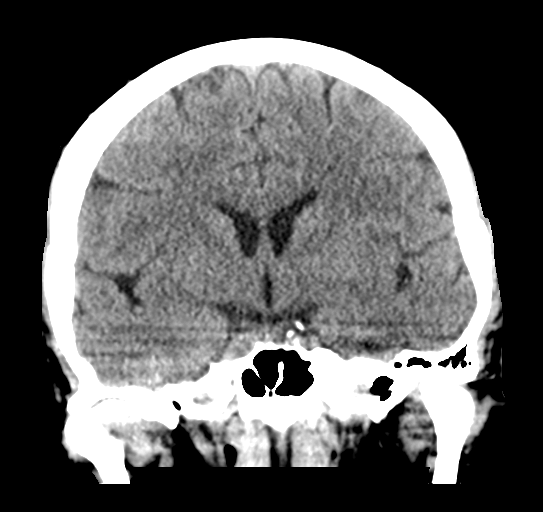
[im 37/67  brain]
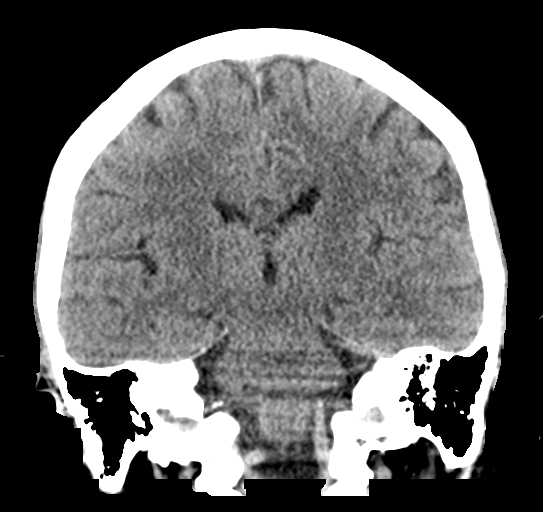

[Series 5: sagittal soft · sagittal · 0.33mm/px · 3 of 63 slices shown]
[im 21/63  brain]
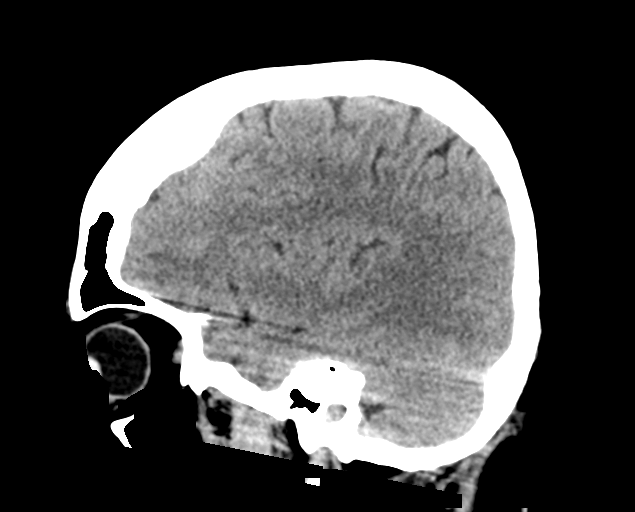
[im 32/63  brain]
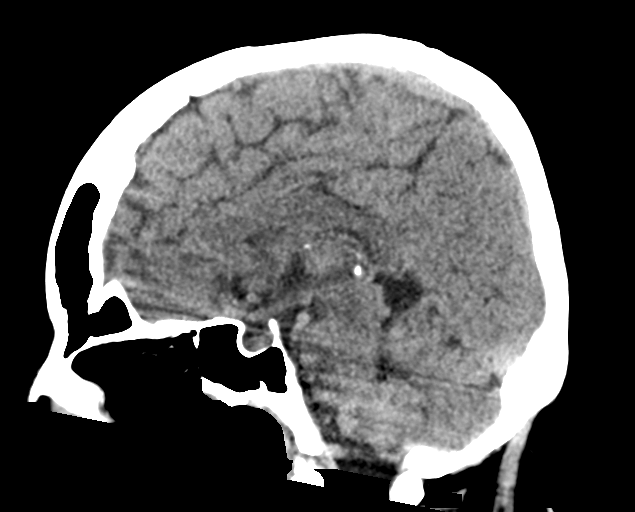
[im 42/63  brain]
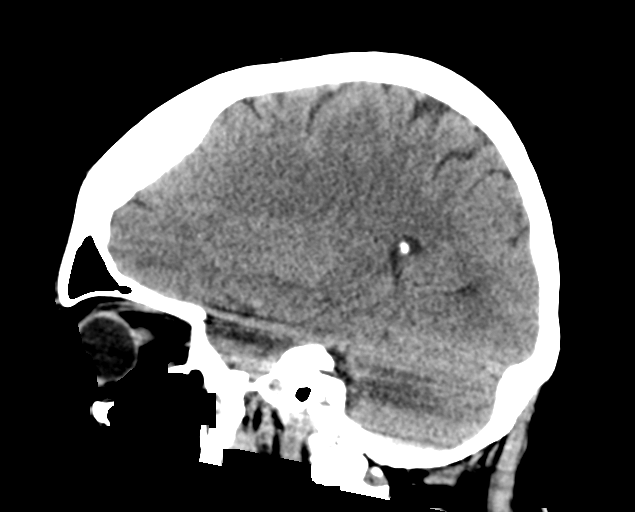

[15 of 47 positions shown; findings below may reference images not displayed]

FINDINGS: Brain: There is no acute intracranial hemorrhage, extra-axial fluid
collection, or acute infarct.

Parenchymal volume is normal. The ventricles are normal in size.
Gray-white differentiation is preserved.

There is no mass lesion.  There is no mass effect or midline shift.

Vascular: No hyperdense vessel or unexpected calcification.

Skull: Normal. Negative for fracture or focal lesion.

Sinuses/Orbits: The imaged paranasal sinuses are clear. The globes
and orbits are unremarkable.

Other: None.
IMPRESSION: Normal head CT.

## 2021-07-10 MED ORDER — ASPIRIN 325 MG PO TABS: 325.0000 mg | ORAL_TABLET | Freq: Every day | ORAL | Status: DC

## 2021-07-10 MED ORDER — ACETAMINOPHEN 160 MG/5ML PO SOLN: 650.0000 mg | ORAL | Status: DC | PRN

## 2021-07-10 MED ORDER — ACETAMINOPHEN 325 MG PO TABS: 650.0000 mg | ORAL_TABLET | ORAL | Status: DC | PRN

## 2021-07-10 MED ORDER — SODIUM CHLORIDE 0.9 % IV SOLN
INTRAVENOUS | Status: DC
  Administered 2021-07-11: 1000 mL via INTRAVENOUS

## 2021-07-10 MED ORDER — STROKE: EARLY STAGES OF RECOVERY BOOK
Freq: Once | Status: AC
  Filled 2021-07-10: qty 1

## 2021-07-10 MED ORDER — LEVOTHYROXINE SODIUM 25 MCG PO TABS
25.0000 ug | ORAL_TABLET | Freq: Every day | ORAL | Status: DC
  Administered 2021-07-11: 25 ug via ORAL
  Filled 2021-07-10: qty 1

## 2021-07-10 MED ORDER — ACETAMINOPHEN 650 MG RE SUPP: 650.0000 mg | RECTAL | Status: DC | PRN

## 2021-07-10 MED ORDER — ASPIRIN 300 MG RE SUPP: 300.0000 mg | Freq: Every day | RECTAL | Status: DC

## 2021-07-10 MED ORDER — ENOXAPARIN SODIUM 40 MG/0.4ML IJ SOSY
40.0000 mg | PREFILLED_SYRINGE | INTRAMUSCULAR | Status: DC
  Administered 2021-07-11: 40 mg via SUBCUTANEOUS
  Filled 2021-07-10: qty 0.4

## 2021-07-10 MED ORDER — LORAZEPAM 2 MG/ML IJ SOLN
1.0000 mg | Freq: Once | INTRAMUSCULAR | Status: AC | PRN
  Administered 2021-07-11: 1 mg via INTRAVENOUS
  Filled 2021-07-10: qty 1

## 2021-07-10 NOTE — ED Triage Notes (Signed)
Pt in from home c/o diaphoresis and L arm heaviness onset today @ 2:30pm while looking at her computer, the pt called her PCP and was told to come here, pt denies visual changes, denies slurred speech, denies gait changes, denies CP, n/v/d, A&O x4

## 2021-07-10 NOTE — ED Provider Notes (Signed)
[facility] Provider Note   CSN: [id number] Arrival date & time: 07/10/21  1551     History  No chief complaint on file.   Nancy Chambers is a 66 y.o. female.  HPI Patient was working at home.  States around 2:00 began to feel bad.  Reported got sweaty.  No chest pain.  Reported left arm was feeling heavy.  Less heavy now but still states he does not feel back to baseline.  No headache.  No confusion.  Has been doing well otherwise.  No chest pain.  Awake and appropriate.   Past Medical History:  Diagnosis Date   Allergy    Anxiety    Depression    Thyroid disease    Home Medications Prior to Admission medications   Medication Sig Start Date End Date Taking? Authorizing Provider  Cholecalciferol (VITAMIN D PO) Take 1,000 Units by mouth.     [provider]  levothyroxine (SYNTHROID, LEVOTHROID) 25 MCG tablet TAKE 1 TABLET (25 MCG TOTAL) BY MOUTH DAILY BEFORE BREAKFAST. 01/18/18   Renato Shin, MD  Multiple Vitamin (MULTIVITAMIN) tablet Take 1 tablet by mouth daily.    [provider]      Allergies    Cymbalta [duloxetine hcl], Paxil [paroxetine hcl], Wellbutrin [bupropion], Zoloft [sertraline hcl], and Prozac [fluoxetine hcl]    Review of Systems   Review of Systems  Constitutional:  Positive for diaphoresis.  Respiratory:  Negative for shortness of breath.   Neurological:  Positive for light-headedness.   Physical Exam Updated Vital Signs BP (!) 119/53 (BP Location: Right Arm)   Pulse 76   Temp 97.7 F (36.5 C) (Oral)   Resp 16   Ht 5\' 6"  (1.676 m)   Wt 113.4 kg   SpO2 97%   BMI 40.35 kg/m  Physical Exam Vitals and nursing note reviewed.  HENT:     Head: Normocephalic.  Eyes:     Extraocular Movements: Extraocular movements intact.     Pupils: Pupils are equal, round, and reactive to light.  Cardiovascular:     Rate and Rhythm: Regular rhythm.  Abdominal:     Tenderness: There is no abdominal tenderness.   Musculoskeletal:     Cervical back: Neck supple.  Neurological:     Mental Status: She is alert and oriented to person, place, and time.     Comments: Awake and appropriate.  Eye movements intact.  Finger-nose intact bilaterally.  Normal speech.    ED Results / Procedures / Treatments   Labs (all labs ordered are listed, but only abnormal results are displayed) Labs Reviewed  COMPREHENSIVE METABOLIC PANEL - Abnormal; Notable for the following components:      Result Value   Glucose, Bld 103 (*)    Creatinine, Ser 1.28 (*)    GFR, Estimated 47 (*)    All other components within normal limits  CBC WITH DIFFERENTIAL/PLATELET  TSH  TROPONIN I (HIGH SENSITIVITY)    EKG EKG Interpretation  Date/Time:  Friday July 10 2021 16:07:12 EDT Ventricular Rate:  75 PR Interval:  138 QRS Duration: 76 QT Interval:  386 QTC Calculation: 431 R Axis:   34 Text Interpretation: Normal sinus rhythm Low voltage QRS Borderline ECG When compared with ECG of 11-Jul-2015 06:22, No significant change since last tracing Confirmed by Davonna Belling [phone]) on 07/10/2021 9:18:50 PM  Radiology DG Chest 2 View  Result Date: 07/10/2021 CLINICAL DATA:  Weakness EXAM: CHEST - 2 VIEW COMPARISON:  None Available. FINDINGS: The  heart size and mediastinal contours are within normal limits. Both lungs are clear. The visualized skeletal structures are unremarkable. IMPRESSION: No active cardiopulmonary disease. Electronically Signed   By: Jacqulynn Cadet M.D.   On: 07/10/2021 16:40   CT Head Wo Contrast  Result Date: 07/10/2021 CLINICAL DATA:  Diaphoresis, left arm heaviness EXAM: CT HEAD WITHOUT CONTRAST TECHNIQUE: Contiguous axial images were obtained from the base of the skull through the vertex without intravenous contrast. RADIATION DOSE REDUCTION: This exam was performed according to the departmental dose-optimization program which includes automated exposure control, adjustment of the mA and/or kV according  to patient size and/or use of iterative reconstruction technique. COMPARISON:  CT head 07/11/2015 FINDINGS: Brain: There is no acute intracranial hemorrhage, extra-axial fluid collection, or acute infarct. Parenchymal volume is normal. The ventricles are normal in size. Gray-white differentiation is preserved. There is no mass lesion.  There is no mass effect or midline shift. Vascular: No hyperdense vessel or unexpected calcification. Skull: Normal. Negative for fracture or focal lesion. Sinuses/Orbits: The imaged paranasal sinuses are clear. The globes and orbits are unremarkable. Other: None. IMPRESSION: Normal head CT. Electronically Signed   By: Valetta Mole M.D.   On: 07/10/2021 17:35    Procedures Procedures    Medications Ordered in ED Medications - No data to display  ED Course/ Medical Decision Making/ A&P                           Medical Decision Making Amount and/or Complexity of Data Reviewed Labs: ordered. Radiology: ordered.  Risk Decision regarding hospitalization.   Patient presented with an episode of feeling bad.  Began while she was working in [employer].  States that her left arm began to feel heavy.  Was reportedly sweaty also.  Felt bad.  Lasted over an hour.  States arm still feels a little not at baseline.  Exam reassuring.  Head CT done and reassuring.  Troponin negative.  Doubt cardiac ischemia as the cause.  However with unilateral weakness her ABCD2 score is 5 which puts her at moderate risk.  With that increased risk I feel that she would benefit from admission to the hospital for further work-up.  Informed that she may end up having to go to an transferred to another hospital to get more expedited work-up.  Will discuss with hospitalist        Final Clinical Impression(s) / ED Diagnoses Final diagnoses:  Weakness    Rx / DC Orders ED Discharge Orders     None         Davonna Belling, MD 07/10/21 2119

## 2021-07-10 NOTE — H&P (Signed)
History and Physical    PatientMarland Chambers SABIRIN BARAY WUJ:[id number] DOB: 14-Jul-1955 DOA: 07/10/2021 DOS: the patient was seen and examined on 07/10/2021 PCP: Hart Robinsons, DO  Patient coming from: Home  Chief Complaint: No chief complaint on file.  HPI: Nancy Chambers is a 66 y.o. female with medical history significant of hypothyroidism and depression.  Patient presents to the hospital with left arm heaviness and diaphoresis that started around 3:00.  The diaphoresis lasted for few minutes, but the left arm heaviness lasted for close to an hour.  Due to the symptoms, the patient did not feel safe to drive and had her sister-in-law drive her to the hospital for evaluation.  She did have a headache earlier today.  She took Tylenol with improvement.  She reports no previous episodes similar to this.  She does report a new medication -she started Wellbutrin approximately 4 to 5 days ago.  She has not experienced any side effects to the medication that she is aware of.  She has been on Wellbutrin before and does not remember any adverse side effects.  Review of Systems: As mentioned in the history of present illness. All other systems reviewed and are negative. Past Medical History:  Diagnosis Date   Allergy    Anxiety    Depression    Thyroid disease    Past Surgical History:  Procedure Laterality Date   Broken nose surg     DILATION AND CURETTAGE OF UTERUS     VARICOSE VEIN SURGERY     Social History:  reports that she quit smoking about 28 years ago. Her smoking use included cigarettes. She has never used smokeless tobacco. She reports that she does not drink alcohol and does not use drugs.  Allergies  Allergen Reactions   Cymbalta [Duloxetine Hcl] Nausea And Vomiting    Stomach pain, kidney pain, choking   Paxil [Paroxetine Hcl]     Weight gain   Wellbutrin [Bupropion]     Feels too flirty   Zoloft [Sertraline Hcl]     Weight gain   Prozac [Fluoxetine Hcl] Rash    Family History   Problem Relation Age of Onset   Pancreatic cancer Mother    Liver cancer Mother    Stroke Mother    Hypertension Mother    Heart attack Mother    Heart attack Father    Lung cancer Sister    Breast cancer Sister 85   Thyroid disease Neg Hx     Prior to Admission medications   Medication Sig Start Date End Date Taking? Authorizing Provider  Cholecalciferol (VITAMIN D PO) Take 1,000 Units by mouth.     [provider]  levothyroxine (SYNTHROID, LEVOTHROID) 25 MCG tablet TAKE 1 TABLET (25 MCG TOTAL) BY MOUTH DAILY BEFORE BREAKFAST. 01/18/18   Romero Belling, MD  Multiple Vitamin (MULTIVITAMIN) tablet Take 1 tablet by mouth daily.    [provider]    Physical Exam: Vitals:   07/10/21 1600 07/10/21 1601 07/10/21 1605  BP:  (!) 119/53   Pulse:  76   Resp:  16   Temp:  97.7 F (36.5 C)   TempSrc:  Oral   SpO2:  97%   Weight: 113.4 kg    Height: 5\' 6"  (1.676 m)  5\' 6"  (1.676 m)   General: Older female. Awake and alert and oriented x3. No acute cardiopulmonary distress.  HEENT: Normocephalic atraumatic.  Right and left ears normal in appearance.  Pupils equal, round, reactive to light.  Extraocular muscles are intact. Sclerae anicteric and noninjected.  Moist mucosal membranes. No mucosal lesions.  Neck: Neck supple without lymphadenopathy. No carotid bruits. No masses palpated.  Cardiovascular: Regular rate with normal S1-S2 sounds. No murmurs, rubs, gallops auscultated. No JVD.  Respiratory: Good respiratory effort with no wheezes, rales, rhonchi. Lungs clear to auscultation bilaterally.  No accessory muscle use. Abdomen: Soft, nontender, nondistended. Active bowel sounds. No masses or hepatosplenomegaly  Skin: No rashes, lesions, or ulcerations.  Dry, warm to touch. 2+ dorsalis pedis and radial pulses. Musculoskeletal: No calf or leg pain. All major joints not erythematous nontender.  No upper or lower joint deformation.  Good ROM.  No contractures  Psychiatric:  Intact judgment and insight. Pleasant and cooperative. Neurologic: No focal neurological deficits. Strength is 5/5 and symmetric in upper and lower extremities.  Cranial nerves II through XII are grossly intact.  Data Reviewed: Results for orders placed or performed during the hospital encounter of 07/10/21 (from the past 24 hour(s))  CBC with Differential     Status: None   Collection Time: 07/10/21  4:42 PM  Result Value Ref Range   WBC 4.4 4.0 - 10.5 K/uL   RBC 4.52 3.87 - 5.11 MIL/uL   Hemoglobin 13.9 12.0 - 15.0 g/dL   HCT 09.7 35.3 - 29.9 %   MCV 94.2 80.0 - 100.0 fL   MCH 30.8 26.0 - 34.0 pg   MCHC 32.6 30.0 - 36.0 g/dL   RDW 24.2 68.3 - 41.9 %   Platelets 241 150 - 400 K/uL   nRBC 0.0 0.0 - 0.2 %   Neutrophils Relative % 59 %   Neutro Abs 2.6 1.7 - 7.7 K/uL   Lymphocytes Relative 28 %   Lymphs Abs 1.2 0.7 - 4.0 K/uL   Monocytes Relative 7 %   Monocytes Absolute 0.3 0.1 - 1.0 K/uL   Eosinophils Relative 5 %   Eosinophils Absolute 0.2 0.0 - 0.5 K/uL   Basophils Relative 1 %   Basophils Absolute 0.1 0.0 - 0.1 K/uL   Immature Granulocytes 0 %   Abs Immature Granulocytes 0.01 0.00 - 0.07 K/uL  Comprehensive metabolic panel     Status: Abnormal   Collection Time: 07/10/21  4:42 PM  Result Value Ref Range   Sodium 141 135 - 145 mmol/L   Potassium 3.6 3.5 - 5.1 mmol/L   Chloride 109 98 - 111 mmol/L   CO2 27 22 - 32 mmol/L   Glucose, Bld 103 (H) 70 - 99 mg/dL   BUN 14 8 - 23 mg/dL   Creatinine, Ser 6.22 (H) 0.44 - 1.00 mg/dL   Calcium 9.0 8.9 - 29.7 mg/dL   Total Protein 6.8 6.5 - 8.1 g/dL   Albumin 4.2 3.5 - 5.0 g/dL   AST 21 15 - 41 U/L   ALT 22 0 - 44 U/L   Alkaline Phosphatase 57 38 - 126 U/L   Total Bilirubin 0.6 0.3 - 1.2 mg/dL   GFR, Estimated 47 (L) >60 mL/min   Anion gap 5 5 - 15  Troponin I (High Sensitivity)     Status: None   Collection Time: 07/10/21  4:42 PM  Result Value Ref Range   Troponin I (High Sensitivity) <2 <18 ng/L  TSH     Status: None    Collection Time: 07/10/21  4:42 PM  Result Value Ref Range   TSH 2.619 0.350 - 4.500 uIU/mL   DG Chest 2 View  Result Date: 07/10/2021 CLINICAL DATA:  Weakness  EXAM: CHEST - 2 VIEW COMPARISON:  None Available. FINDINGS: The heart size and mediastinal contours are within normal limits. Both lungs are clear. The visualized skeletal structures are unremarkable. IMPRESSION: No active cardiopulmonary disease. Electronically Signed   By: Malachy MoanHeath  McCullough M.D.   On: 07/10/2021 16:40   CT Head Wo Contrast  Result Date: 07/10/2021 CLINICAL DATA:  Diaphoresis, left arm heaviness EXAM: CT HEAD WITHOUT CONTRAST TECHNIQUE: Contiguous axial images were obtained from the base of the skull through the vertex without intravenous contrast. RADIATION DOSE REDUCTION: This exam was performed according to the departmental dose-optimization program which includes automated exposure control, adjustment of the mA and/or kV according to patient size and/or use of iterative reconstruction technique. COMPARISON:  CT head 07/11/2015 FINDINGS: Brain: There is no acute intracranial hemorrhage, extra-axial fluid collection, or acute infarct. Parenchymal volume is normal. The ventricles are normal in size. Gray-white differentiation is preserved. There is no mass lesion.  There is no mass effect or midline shift. Vascular: No hyperdense vessel or unexpected calcification. Skull: Normal. Negative for fracture or focal lesion. Sinuses/Orbits: The imaged paranasal sinuses are clear. The globes and orbits are unremarkable. Other: None. IMPRESSION: Normal head CT. Electronically Signed   By: Lesia HausenPeter  Noone M.D.   On: 07/10/2021 17:35     Assessment and Plan: No notes have been filed under this hospital service. Service: Hospitalist  Principal Problem:   Left arm weakness Active Problems:   Depression, major, single episode, mild (HCC)   Hypothyroidism  Left arm weakness Observation on telemetry MRI head Echocardiogram  tomorrow Hemoglobin A1c, lipid panel in the morning PT/OT/speech therapy consult Full aspirin Hypothyroidism Check TSH, T3, T4 Depression We will hold Wellbutrin for now   Advance Care Planning:   Code Status: Not on file full code  Consults: None  Family Communication:   Severity of Illness: The appropriate patient status for this patient is OBSERVATION. Observation status is judged to be reasonable and necessary in order to provide the required intensity of service to ensure the patient's safety. The patient's presenting symptoms, physical exam findings, and initial radiographic and laboratory data in the context of their medical condition is felt to place them at decreased risk for further clinical deterioration. Furthermore, it is anticipated that the patient will be medically stable for discharge from the hospital within 2 midnights of admission.   Author: Levie HeritageJacob J French Kendra, DO 07/10/2021 9:59 PM  For on call review [email or web address].

## 2021-07-11 ENCOUNTER — Observation Stay (HOSPITAL_BASED_OUTPATIENT_CLINIC_OR_DEPARTMENT_OTHER): Payer: 59

## 2021-07-11 ENCOUNTER — Observation Stay (HOSPITAL_COMMUNITY): Payer: 59

## 2021-07-11 DIAGNOSIS — E039 Hypothyroidism, unspecified: Secondary | ICD-10-CM | POA: Diagnosis not present

## 2021-07-11 DIAGNOSIS — G459 Transient cerebral ischemic attack, unspecified: Secondary | ICD-10-CM

## 2021-07-11 DIAGNOSIS — R531 Weakness: Secondary | ICD-10-CM | POA: Diagnosis not present

## 2021-07-11 DIAGNOSIS — E785 Hyperlipidemia, unspecified: Secondary | ICD-10-CM | POA: Diagnosis not present

## 2021-07-11 DIAGNOSIS — R29898 Other symptoms and signs involving the musculoskeletal system: Secondary | ICD-10-CM | POA: Diagnosis not present

## 2021-07-11 DIAGNOSIS — Z87891 Personal history of nicotine dependence: Secondary | ICD-10-CM | POA: Diagnosis not present

## 2021-07-11 DIAGNOSIS — Z79899 Other long term (current) drug therapy: Secondary | ICD-10-CM | POA: Diagnosis not present

## 2021-07-11 LAB — LIPID PANEL
Cholesterol: 203 mg/dL — ABNORMAL HIGH (ref 0–200)
HDL: 51 mg/dL (ref >40)
LDL Cholesterol: 138 mg/dL — ABNORMAL HIGH (ref 0–99)
Total CHOL/HDL Ratio: 4 RATIO
Triglycerides: 68 mg/dL (ref <150)
VLDL: 14 mg/dL (ref 0–40)

## 2021-07-11 LAB — ECHOCARDIOGRAM COMPLETE
Area-P 1/2: 4.31 cm2
Height: 66 in
S' Lateral: 2.8 cm
Weight: 3848.35 oz

## 2021-07-11 LAB — HIV ANTIBODY (ROUTINE TESTING W REFLEX): HIV Screen 4th Generation wRfx: NONREACTIVE

## 2021-07-11 LAB — HEMOGLOBIN A1C
Hgb A1c MFr Bld: 5.1 % (ref 4.8–5.6)
Mean Plasma Glucose: 99.67 mg/dL

## 2021-07-11 LAB — TSH: TSH: 3.677 u[IU]/mL (ref 0.350–4.500)

## 2021-07-11 LAB — TROPONIN I (HIGH SENSITIVITY): Troponin I (High Sensitivity): <2 ng/L (ref <18)

## 2021-07-11 LAB — T4, FREE: Free T4: 0.9 ng/dL (ref 0.61–1.12)

## 2021-07-11 IMAGING — MR MR MRA NECK W/ CM
4 of 7 series · 18 of 48 positions shown · IV contrast (10ML GAD)
Comparison: Brain MRI today reported separately.

CLINICAL DATA: 66-year-old female with TIA, left arm weakness.

EXAM:
MRA NECK WITHOUT AND WITH CONTRAST
TECHNIQUE: Angiographic images of the neck were acquired using MRA technique
without and with intravenous contrast. Carotid stenosis measurements
(when applicable) are obtained utilizing NASCET criteria, using the
distal internal carotid diameter as the denominator.
CONTRAST:  10mL GADAVIST GADOBUTROL 1 MMOL/ML IV SOLN

[Series 400: cor cemra ft · coronal · 1.2mm · 0.59mm/px · 7 of 105 slices shown]
[im 1/105]
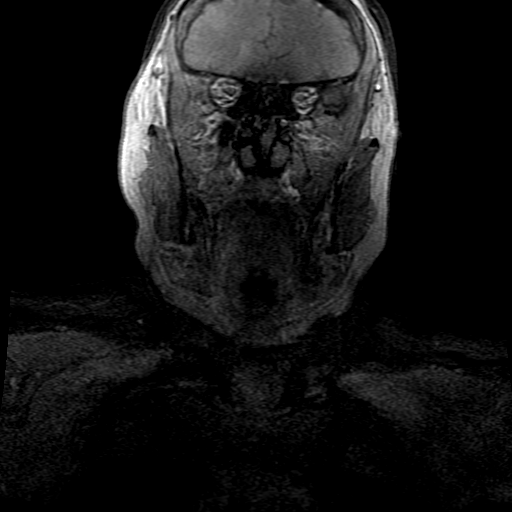
[im 18/105]
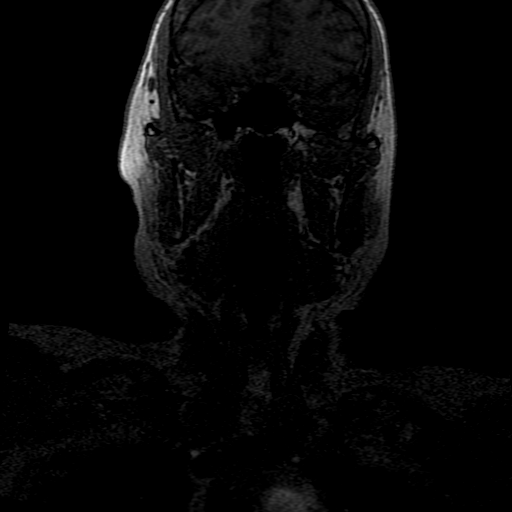
[im 35/105]
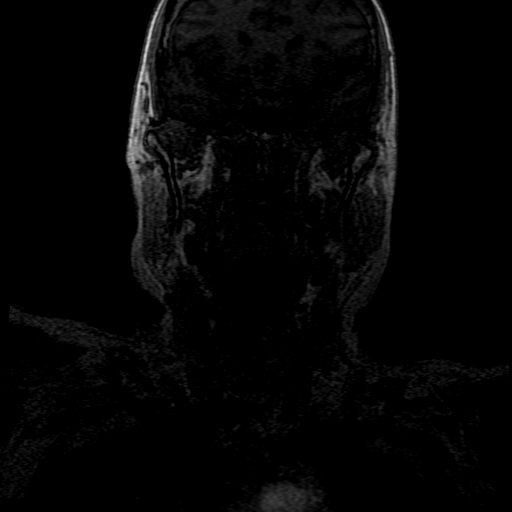
[im 53/105]
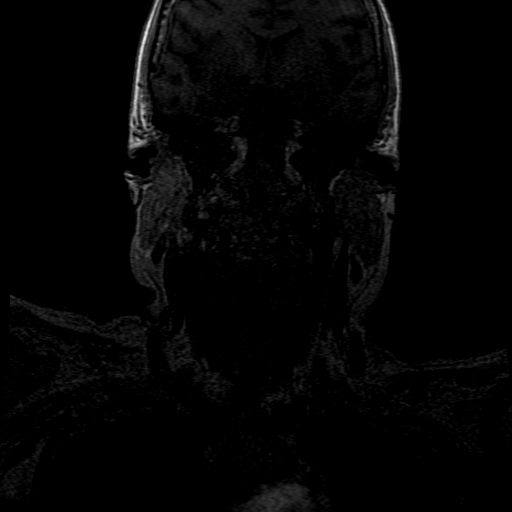
[im 70/105]
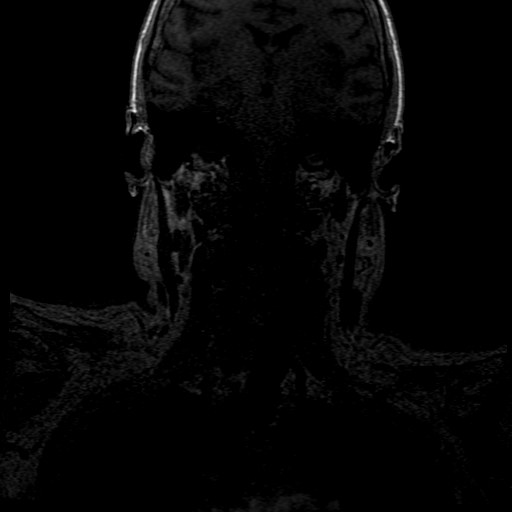
[im 87/105]
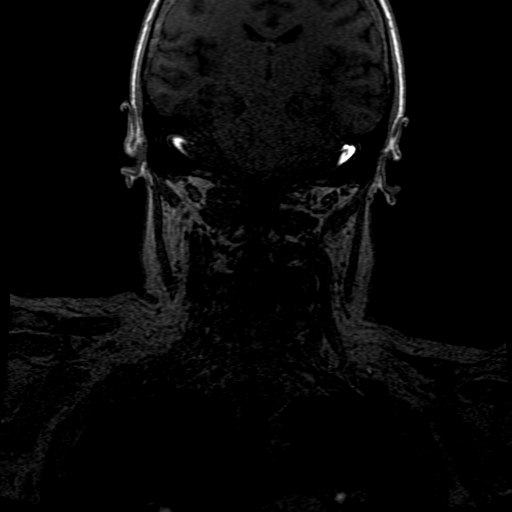
[im 105/105]
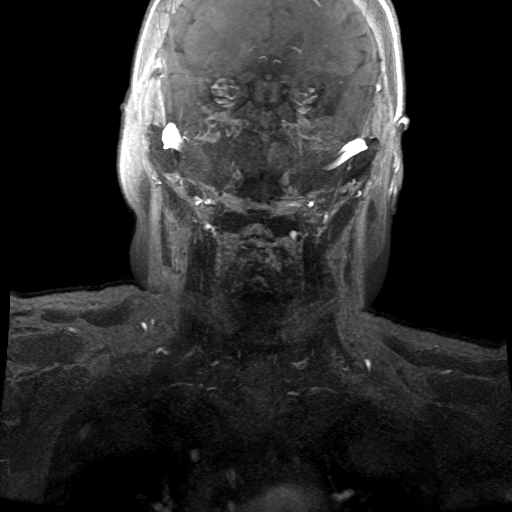

[Series 401: ph1/cor cemra ft · coronal · 1.2mm · 0.59mm/px · 5 of 104 slices shown]
[im 1/104]
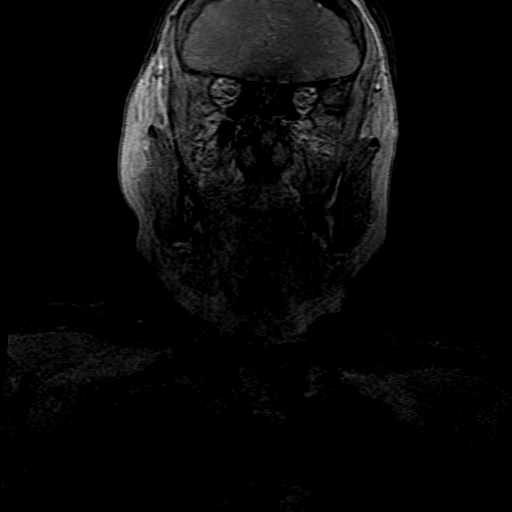
[im 18/104]
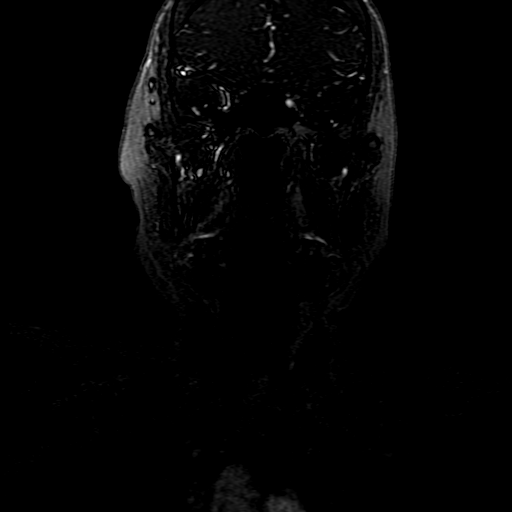
[im 35/104]
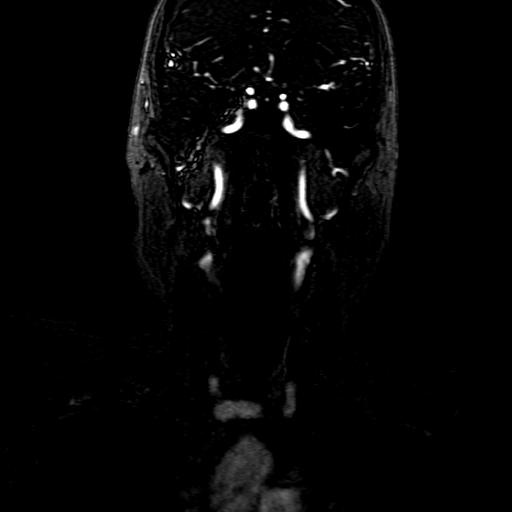
[im 52/104]
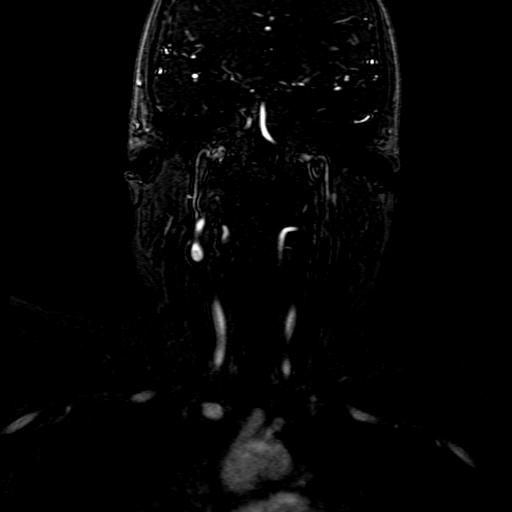
[im 86/104]
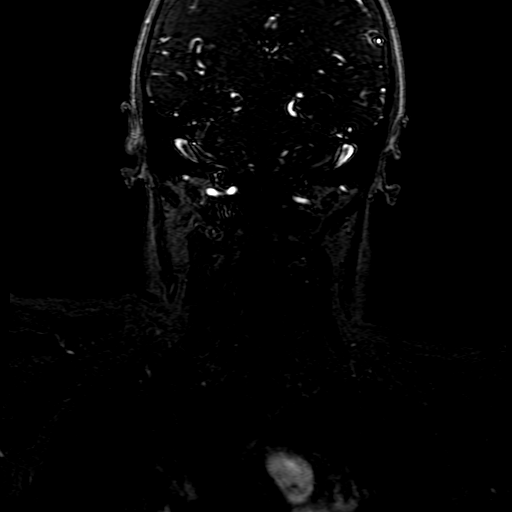

[Series 402: ph2/cor cemra ft · coronal · 1.2mm · 0.59mm/px · 3 of 105 slices shown]
[im 18/105]
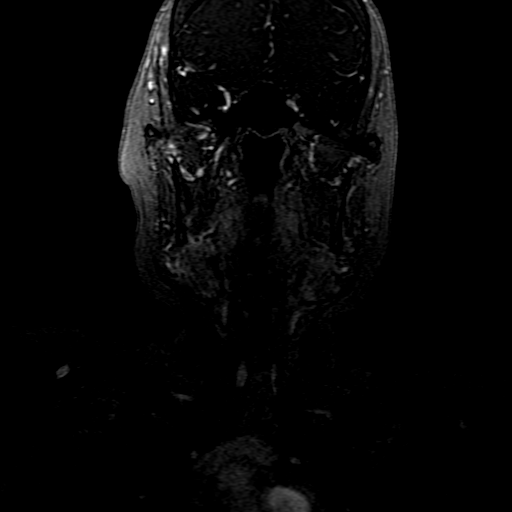
[im 53/105]
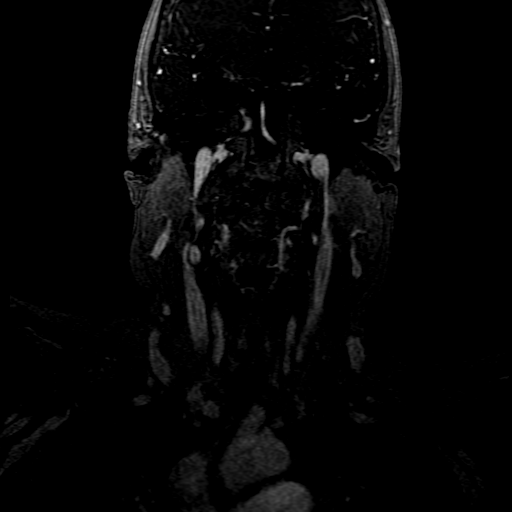
[im 87/105]
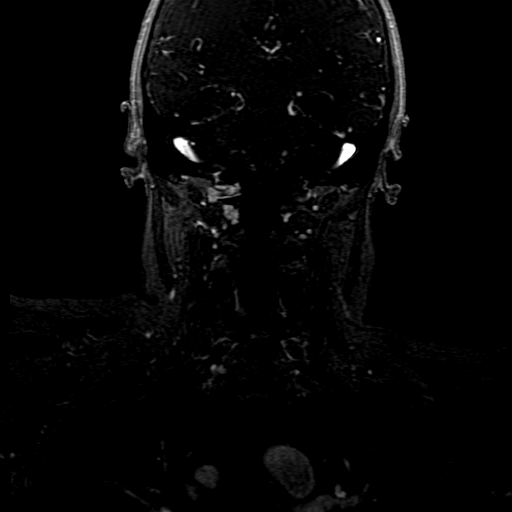

[((date))-((date)) · coronal · 1.2mm · 0.59mm/px · 3 of 105 slices shown]
[im 18/105]
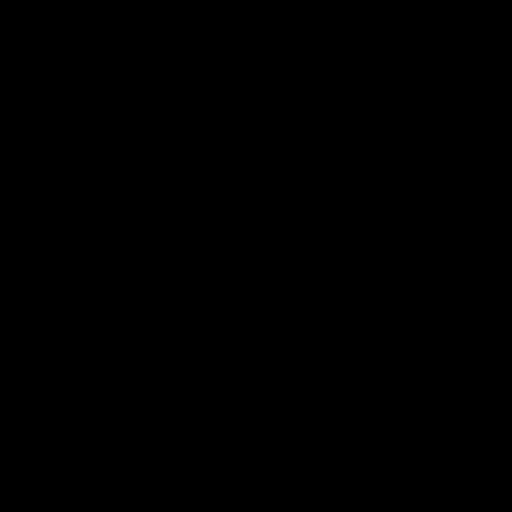
[im 53/105]
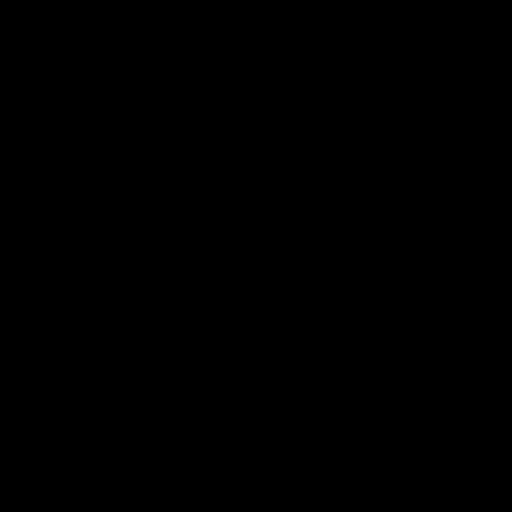
[im 87/105]
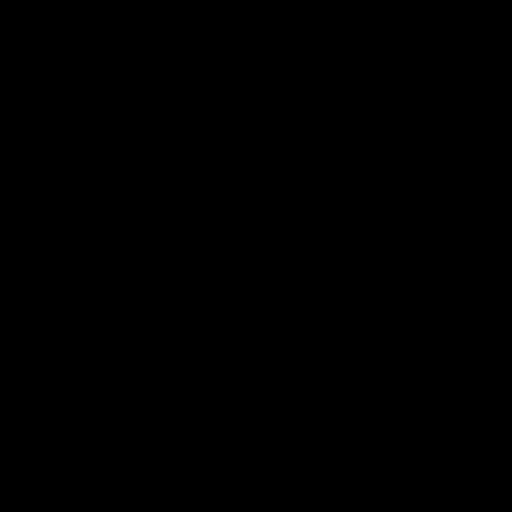

[18 of 48 positions shown; findings below may reference images not displayed]

FINDINGS: Precontrast time-of-flight images demonstrate antegrade flow in the
bilateral cervical carotid and vertebral arteries to the skull base.
Carotid bifurcations appear within normal limits. Right vertebral
artery appears mildly dominant.

Aortic arch: 3 vessel arch configuration, within normal limits.

Right carotid system: Mildly tortuous right CCA without stenosis.
Minor irregularity at the right ICA origin and bulb without
stenosis. Negative visible right ICA siphon and anterior
circulation.

Left carotid system: Tortuous proximal left CCA without evidence of
stenosis. Minimal irregularity at the left ICA origin and bulb. No
stenosis. Negative visible left ICA siphon and anterior circulation.

Vertebral arteries: Proximal right subclavian artery, right
vertebral artery origin, and cervical right vertebral artery are
patent without stenosis. However, there is a subtle beaded
appearance of the left vertebral distal V2 segment on series 405,
image 6.

Proximal left subclavian artery, left vertebral artery origin, and
cervical left vertebral artery are patent without stenosis. There is
a beaded appearance of the left V3 segment (series 406, image 9).

Other: Grossly negative visible neck.
IMPRESSION: 1. Neck MRA suggests minimal cervical carotid atherosclerosis, no
carotid stenosis.
2. Beaded appearance of the distal cervical vertebral arteries
suspicious for Fibromuscular Dysplasia (FMD), but no associated
stenosis.

## 2021-07-11 IMAGING — MR MR MRA HEAD W/O CM
2 series · 19 of 48 positions shown · non-contrast
Comparison: Brain MRI and Neck MRA today reported separately.

CLINICAL DATA: 66-year-old female with TIA, left arm weakness.

EXAM:
MRA HEAD WITHOUT CONTRAST
TECHNIQUE: Angiographic images of the Circle of Willis were acquired using MRA
technique without intravenous contrast.

[Series 7: ax (id) · axial · 1.0mm · 0.43mm/px · z∈[-85,+21]mm · 17 of 224 slices shown]
[im 1/224]
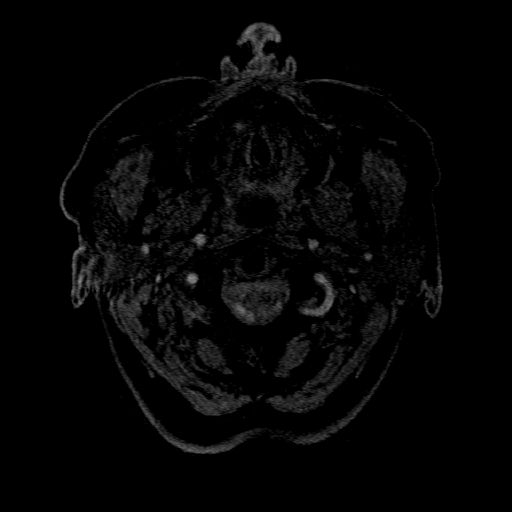
[im 5/224]
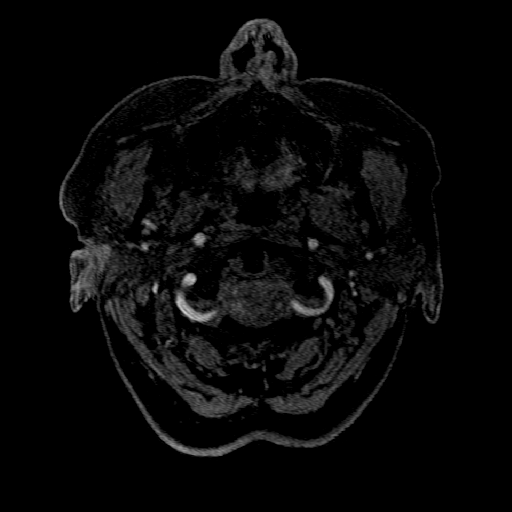
[im 10/224]
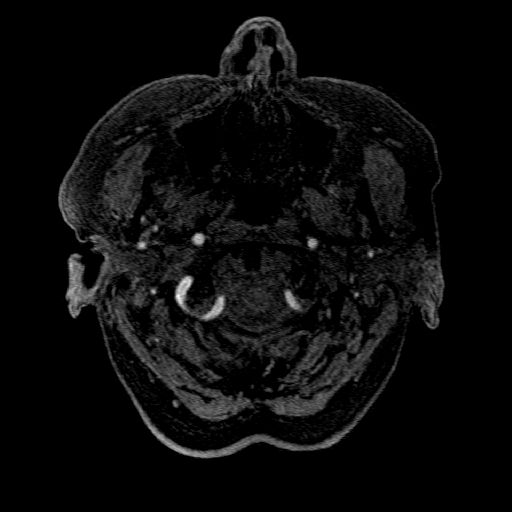
[im 15/224]
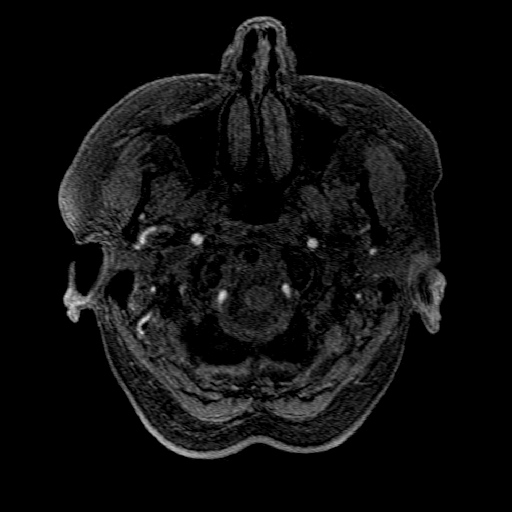
[im 20/224]
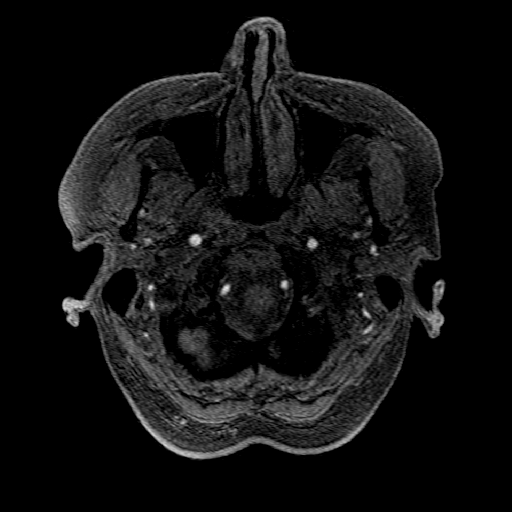
[im 25/224]
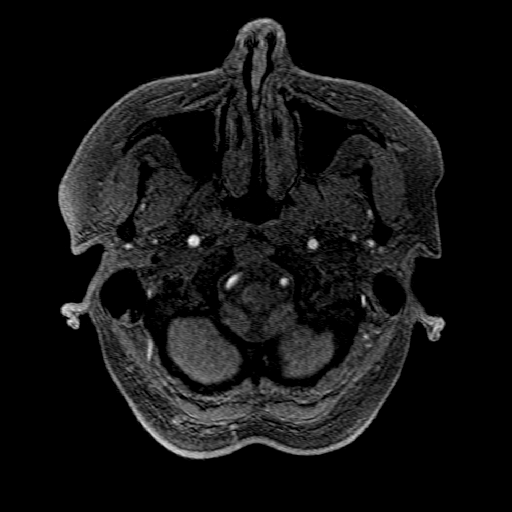
[im 30/224]
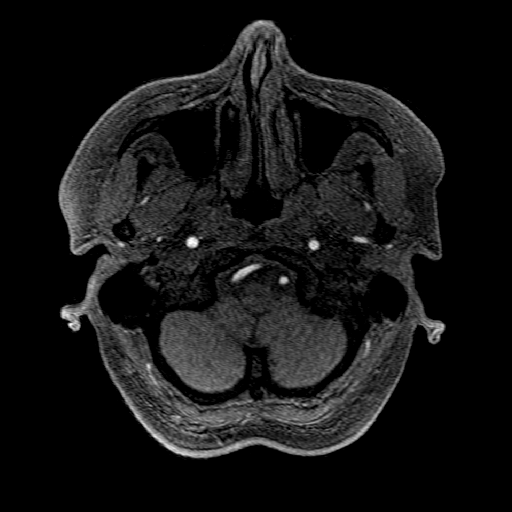
[im 35/224]
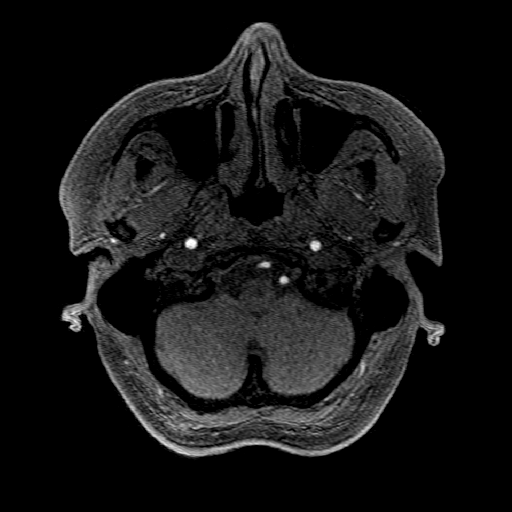
[im 40/224]
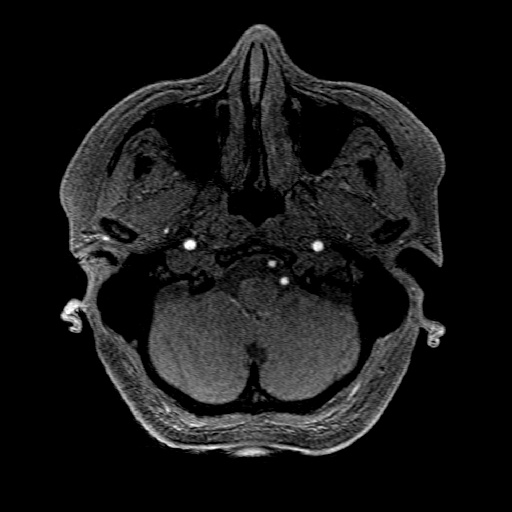
[im 70/224]
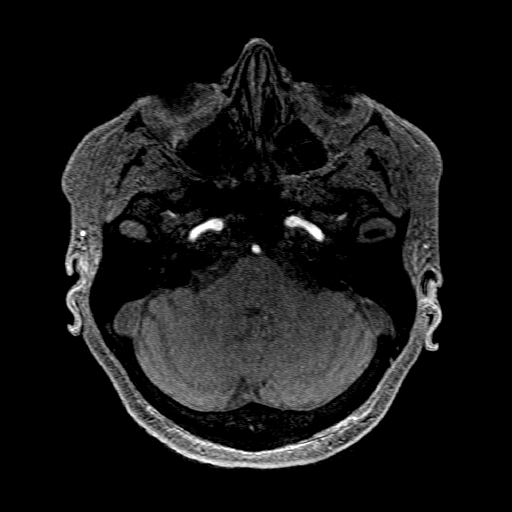
[im 100/224]
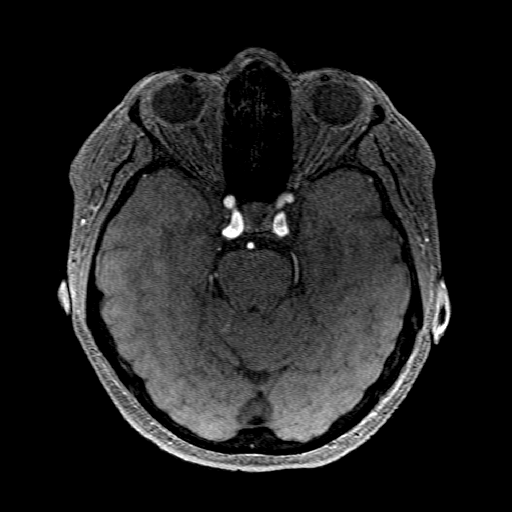
[im 114/224]
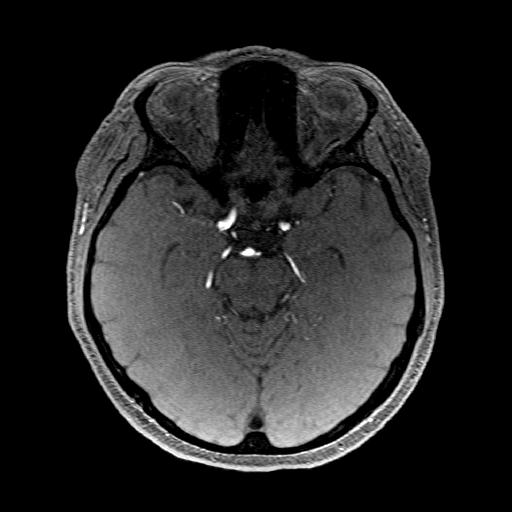
[im 124/224]
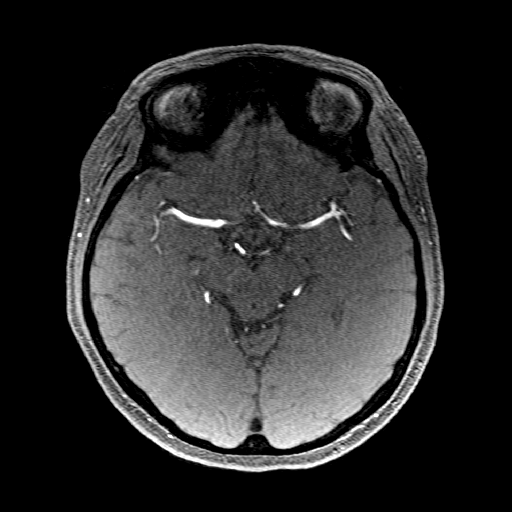
[im 154/224]
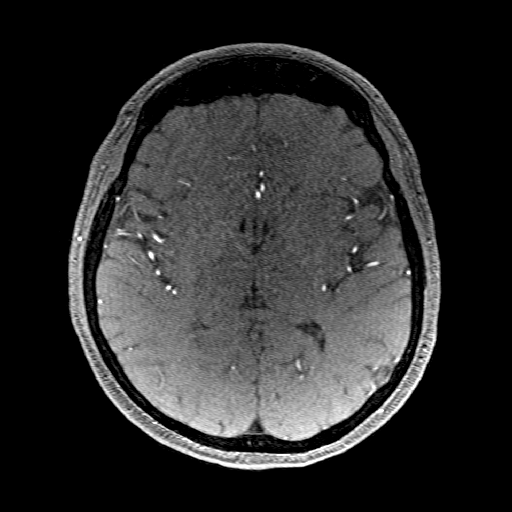
[im 184/224]
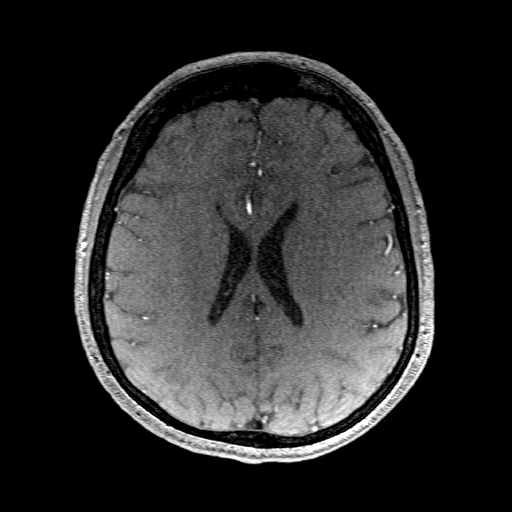
[im 189/224]
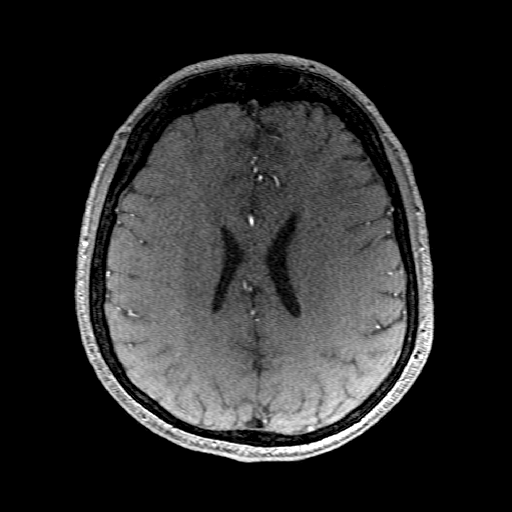
[im 214/224]
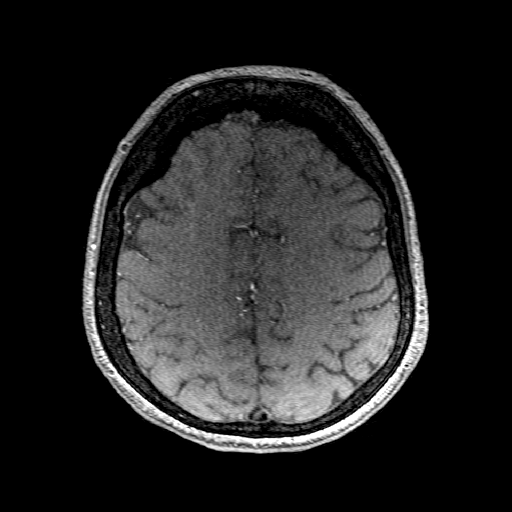

[Series 701: pjn:ax (id) · sagittal · 1.0mm · 0.43mm/px · 2 of 8 slices shown]
[im 1/8]
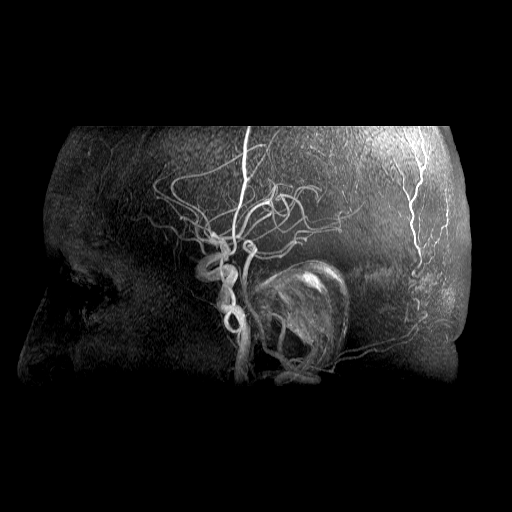
[im 8/8]
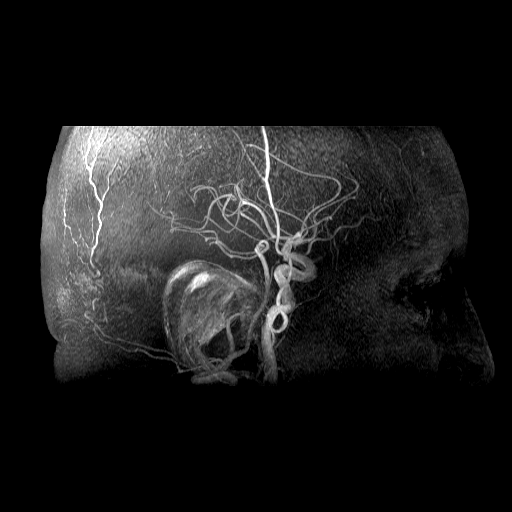

[19 of 48 positions shown; findings below may reference images not displayed]

FINDINGS: Anterior circulation: Antegrade flow in both ICA siphons. No siphon
stenosis. Ophthalmic and posterior communicating artery origins
appear normal. Patent carotid termini, MCA and ACA origins. Dominant
appearing right ACA A1 segment. Anterior communicating artery and
visible ACA branches are within normal limits. Bilateral MCA M1
segments and MCA trifurcations appear patent without stenosis.
Visible bilateral MCA branches are within normal limits.

Posterior circulation: Antegrade flow in the posterior circulation
with fairly codominant distal vertebral arteries. No distal
vertebral or vertebrobasilar junction stenosis. Normal PICA origins.
Patent basilar artery without stenosis. Patent SCA and PCA origins.
Tortuous right P1 segment with mild fetal type right PCA origin.
Diminutive left posterior communicating artery. Bilateral PCA
branches are within normal limits.

Anatomic variants: Dominant right ACA A1. Slightly fetal type right
PCA origin.

Other: No intracranial mass effect or ventriculomegaly.
IMPRESSION: Negative intracranial MRA.

## 2021-07-11 IMAGING — MR MR HEAD W/O CM
6 of 10 series · 27 of 48 positions shown · non-contrast
Comparison: Head CT [DATE].

CLINICAL DATA: 66-year-old female with TIA, left arm weakness.

EXAM:
MRI HEAD WITHOUT CONTRAST
TECHNIQUE: Multiplanar, multiecho pulse sequences of the brain and surrounding
structures were obtained without intravenous contrast.

[Series 2: DWI · axial · 3.0mm · 0.94mm/px · z∈[-84,+59]mm · 8 of 98 slices shown (1 of 2)]
[im 1/98]
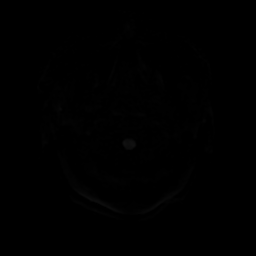
[im 11/98]
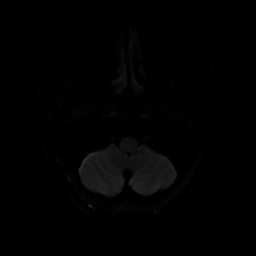
[im 33/98]
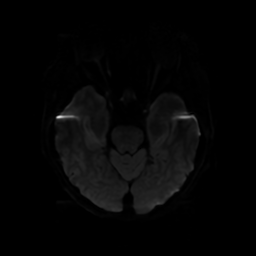
[im 44/98]
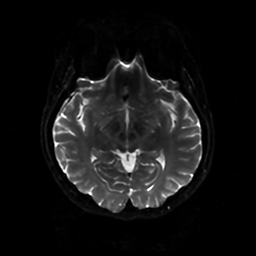
[im 54/98]
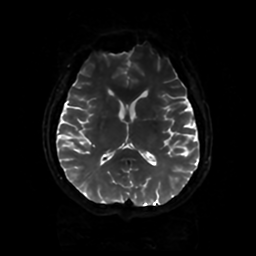
[im 65/98]
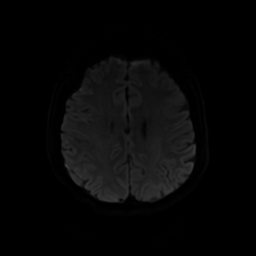
[im 87/98]
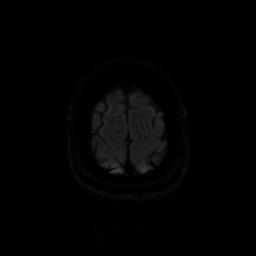
[im 98/98]
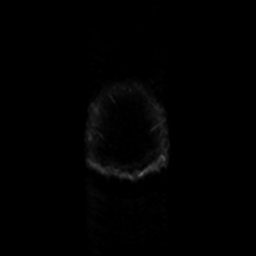

[Series 3: DWI · coronal · 4.0mm · 0.94mm/px · 6 of 68 slices shown (2 of 2)]
[im 1/68]
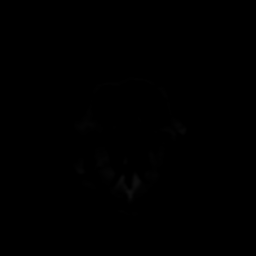
[im 14/68]
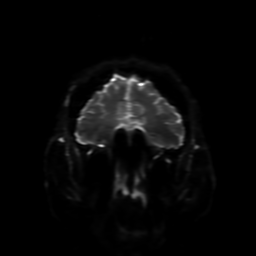
[im 27/68]
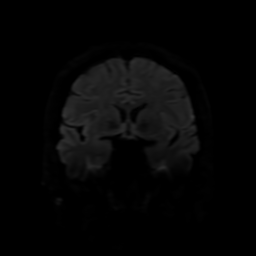
[im 41/68]
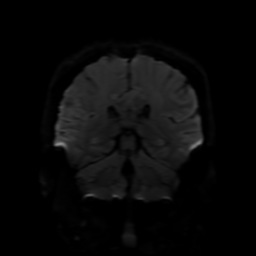
[im 54/68]
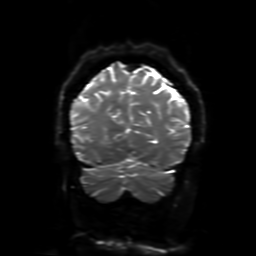
[im 68/68]
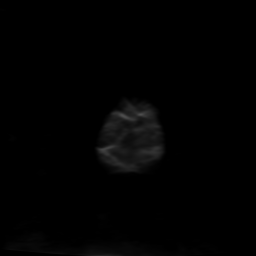

[Series 4: FLAIR · sagittal · 5.0mm · 0.23mm/px · 2 of 24 slices shown (1 of 2)]
[im 1/24]
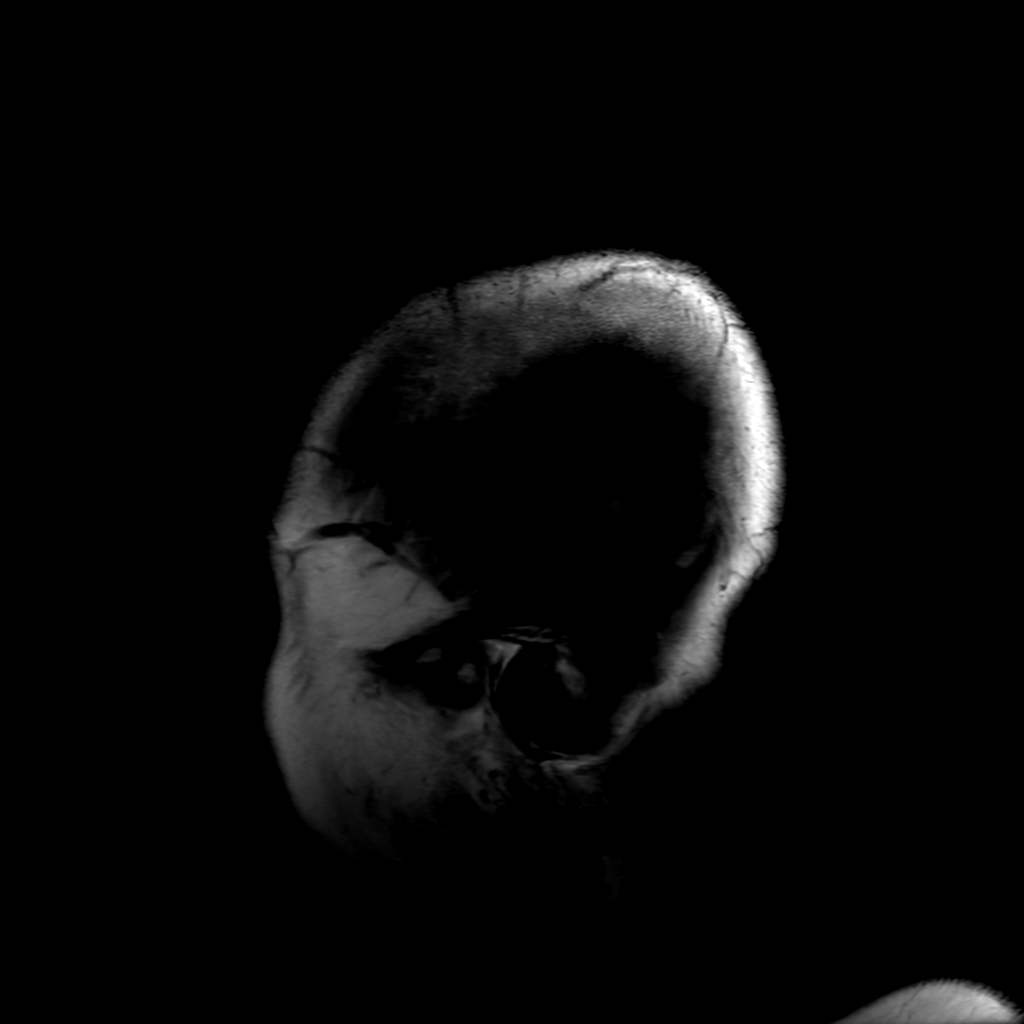
[im 24/24]
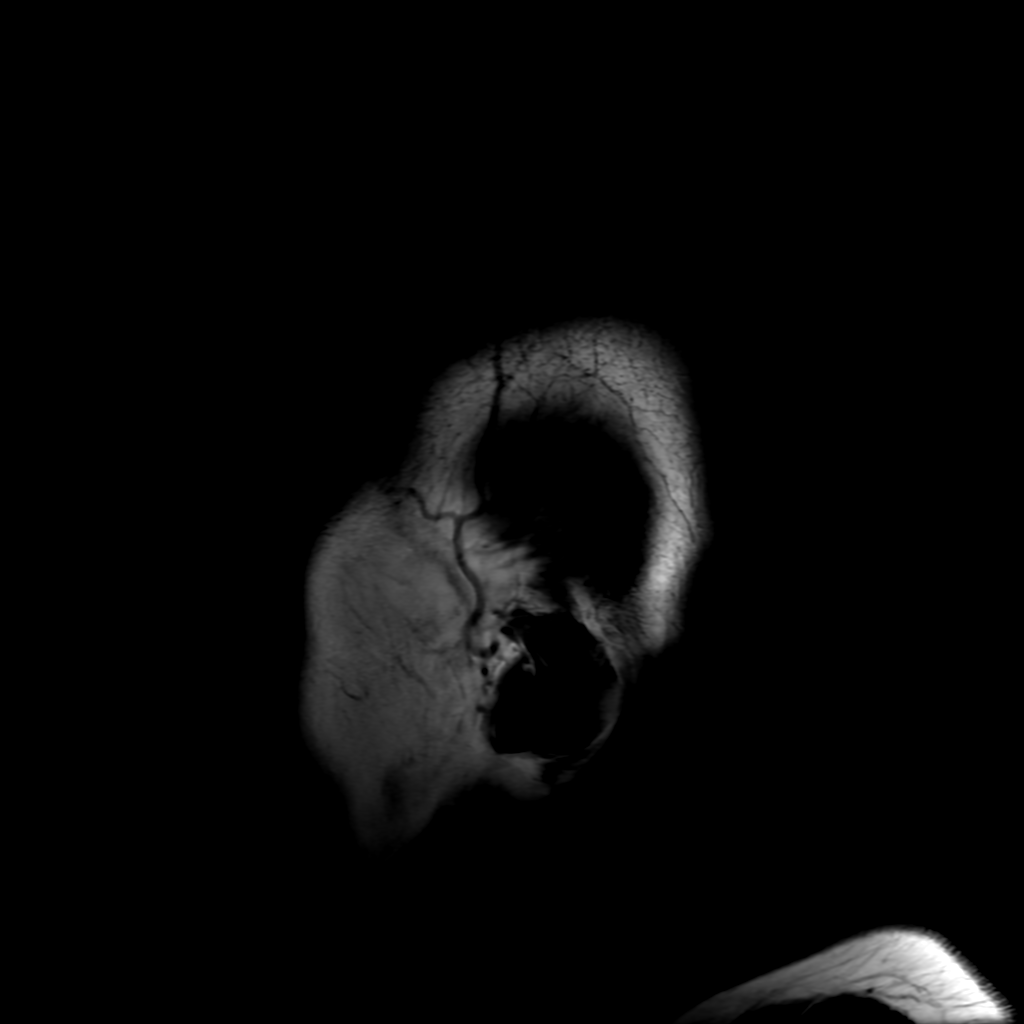

[Series 6: FLAIR · axial · 4.0mm · 0.45mm/px · z∈[-78,+67]mm · 3 of 34 slices shown (2 of 2)]
[im 1/34]
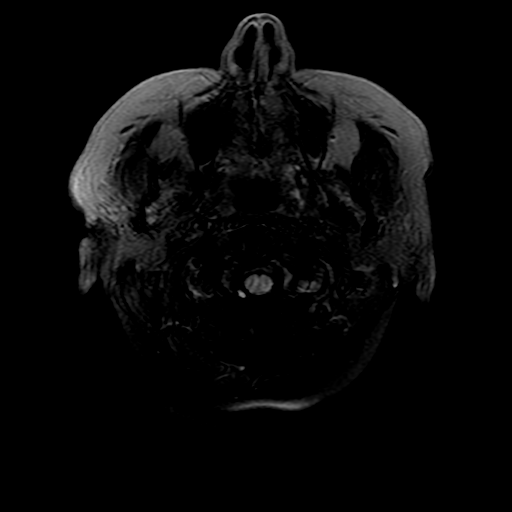
[im 17/34]
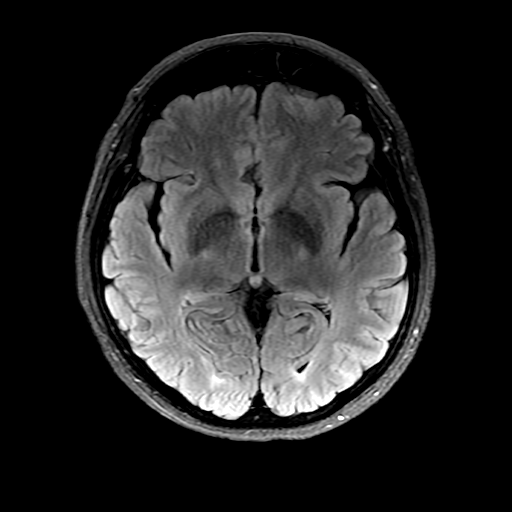
[im 34/34]
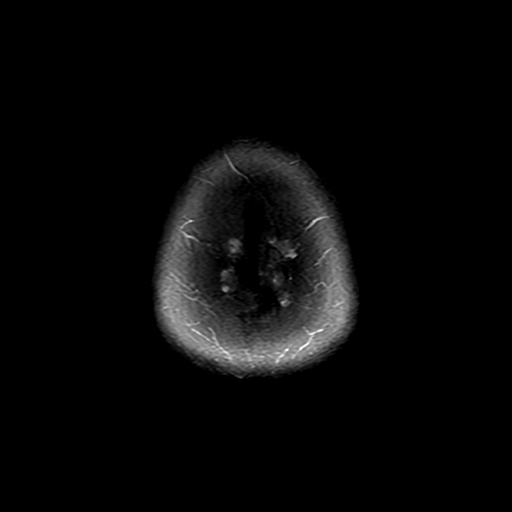

[Series 250: ADC · axial · 3.0mm · 0.94mm/px · z∈[-84,+59]mm · 5 of 49 slices shown (1 of 2)]
[im 1/49]
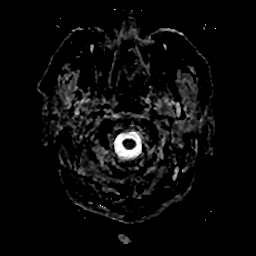
[im 13/49]
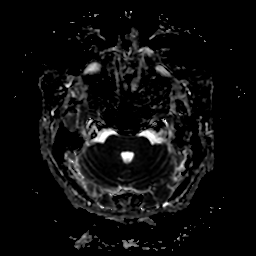
[im 25/49]
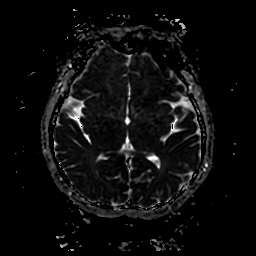
[im 37/49]
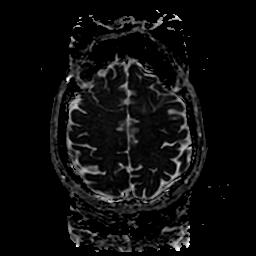
[im 49/49]
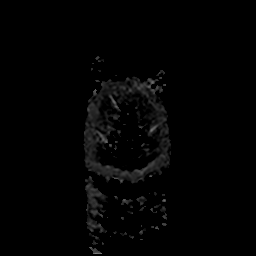

[Series 350: ADC · coronal · 4.0mm · 0.94mm/px · 3 of 33 slices shown (2 of 2)]
[im 1/33]
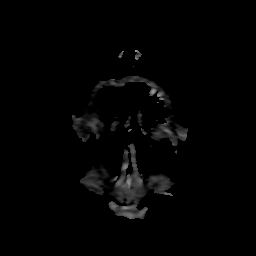
[im 17/33]
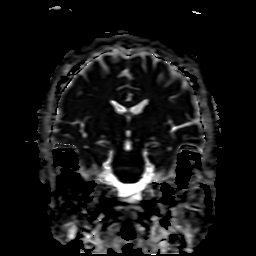
[im 33/33]
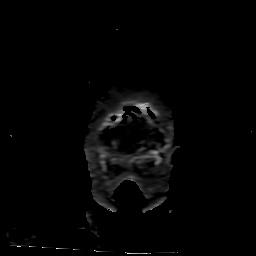

[27 of 48 positions shown; findings below may reference images not displayed]

FINDINGS: Brain: Cerebral volume is within normal limits for age. No
restricted diffusion to suggest acute infarction. No midline shift,
mass effect, evidence of mass lesion, ventriculomegaly, extra-axial
collection or acute intracranial hemorrhage. Cervicomedullary
junction and pituitary are within normal limits.

Largely normal for age gray and white matter signal throughout the
brain. Minimal to mild nonspecific white matter T2 and FLAIR
hyperintensity. No cortical encephalomalacia or chronic cerebral
blood products. Deep gray matter nuclei, brainstem and cerebellum
appear negative.

Vascular: Major intracranial vascular flow voids are preserved. MRA
today is reported separately.

Skull and upper cervical spine: Normal for age visible cervical
spine. Visualized bone marrow signal is within normal limits.

Sinuses/Orbits: Negative. Paranasal sinuses and mastoids are stable
and well aerated.

Other: Grossly normal visible internal auditory structures. Negative
visible scalp and face.
IMPRESSION: No acute intracranial abnormality and essentially normal for age
noncontrast MRI appearance of the brain.

## 2021-07-11 MED ORDER — ALUM & MAG HYDROXIDE-SIMETH 200-200-20 MG/5ML PO SUSP
15.0000 mL | ORAL | Status: DC | PRN
  Administered 2021-07-11: 15 mL via ORAL
  Filled 2021-07-11: qty 30

## 2021-07-11 MED ORDER — ASPIRIN 81 MG PO TBEC: 81.0000 mg | DELAYED_RELEASE_TABLET | Freq: Every day | ORAL | 1 refills | Status: AC

## 2021-07-11 MED ORDER — ASPIRIN 81 MG PO TBEC
81.0000 mg | DELAYED_RELEASE_TABLET | Freq: Every day | ORAL | Status: DC
  Administered 2021-07-11: 81 mg via ORAL
  Filled 2021-07-11: qty 1

## 2021-07-11 MED ORDER — ATORVASTATIN CALCIUM 40 MG PO TABS
40.0000 mg | ORAL_TABLET | Freq: Every day | ORAL | Status: DC
  Administered 2021-07-11: 40 mg via ORAL
  Filled 2021-07-11: qty 1

## 2021-07-11 MED ORDER — GADOBUTROL 1 MMOL/ML IV SOLN
10.0000 mL | Freq: Once | INTRAVENOUS | Status: AC | PRN
  Administered 2021-07-11: 10 mL via INTRAVENOUS

## 2021-07-11 MED ORDER — ATORVASTATIN CALCIUM 40 MG PO TABS: 40.0000 mg | ORAL_TABLET | Freq: Every day | ORAL | 1 refills | Status: AC

## 2021-07-11 NOTE — Plan of Care (Signed)
Patient arrived on unit via stretcher alert and oriented. No distress noted, no complaints voiced. Stated that her left arm no longer felt heavy. Patient settled into room, admission assessment completed. Will continue to monitor according to plan of care and orders.

## 2021-07-11 NOTE — ED Notes (Signed)
[facility] called for transport. 

## 2021-07-11 NOTE — Progress Notes (Signed)
IV removed. Discharge packet given to patient. Patient discharged home.

## 2021-07-11 NOTE — Progress Notes (Signed)
OT Cancellation Note  Patient Details Name: Nancy Chambers MRN: [id number] DOB: August 04, 1955   Cancelled Treatment:    Reason Eval/Treat Not Completed: OT screened, no needs identified, will sign off (Discussed with PT and pt, pt reports back at baseline, numbness and heaviness in LUE has resolved. OT will s/o. Please reconsult if there is a significant change in pt status.)  Alfonzo Beers, OTD, OTR/L Acute Rehab [phone]   Mayer Masker 07/11/2021, 2:16 PM

## 2021-07-11 NOTE — Evaluation (Signed)
Physical Therapy Evaluation & Discharge Patient Details Name: Nancy Chambers MRN: [id number] DOB: Jan 27, 1956 Today's Date: 07/11/2021  History of Present Illness  66 y/o female presented to ED on 07/10/21 for L arm weakness and diaphoresis. Imaging negative. PMH: hypothyroidism, depression  Clinical Impression  Patient admitted with the above. Patient functioning at independent level with no AD which patient reports feeling back to normal after episode of L UE/LE weakness. Educated patient of [facility] for stroke s/s, patient verbalized understanding. No further skilled PT needs identified acutely. No PT follow up recommended at this time.        Recommendations for follow up therapy are one component of a multi-disciplinary discharge planning process, led by the attending physician.  Recommendations may be updated based on patient status, additional functional criteria and insurance authorization.  Follow Up Recommendations No PT follow up    Assistance Recommended at Discharge None  Patient can return home with the following       Equipment Recommendations None recommended by PT  Recommendations for Other Services       Functional Status Assessment Patient has not had a recent decline in their functional status     Precautions / Restrictions Precautions Precautions: None Restrictions Weight Bearing Restrictions: No      Mobility  Bed Mobility Overal bed mobility: Independent                  Transfers Overall transfer level: Independent Equipment used: None                    Ambulation/Gait Ambulation/Gait assistance: Independent Gait Distance (Feet): 250 Feet Assistive device: None Gait Pattern/deviations: WFL(Within Functional Limits)   Gait velocity interpretation: >4.37 ft/sec, indicative of normal walking speed      Stairs            Wheelchair Mobility    Modified Rankin (Stroke Patients Only)       Balance Overall balance  assessment: No apparent balance deficits (not formally assessed)                                           Pertinent Vitals/Pain Pain Assessment Pain Assessment: No/denies pain    Home Living Family/patient expects to be discharged to:: Private residence Living Arrangements: Spouse/significant other Available Help at Discharge: Family Type of Home: House Home Access: Stairs to enter   Secretary/administrator of Steps: 2   Home Layout: One level Home Equipment: None      Prior Function Prior Level of Function : Independent/Modified Independent;Driving                     Hand Dominance        Extremity/Trunk Assessment   Upper Extremity Assessment Upper Extremity Assessment: Overall WFL for tasks assessed    Lower Extremity Assessment Lower Extremity Assessment: Overall WFL for tasks assessed    Cervical / Trunk Assessment Cervical / Trunk Assessment: Normal  Communication   Communication: No difficulties  Cognition Arousal/Alertness: Awake/alert Behavior During Therapy: WFL for tasks assessed/performed Overall Cognitive Status: Within Functional Limits for tasks assessed                                          General Comments  Exercises     Assessment/Plan    PT Assessment Patient does not need any further PT services  PT Problem List         PT Treatment Interventions      PT Goals (Current goals can be found in the Care Plan section)  Acute Rehab PT Goals Patient Stated Goal: to go home PT Goal Formulation: All assessment and education complete, DC therapy    Frequency       Co-evaluation               AM-PAC PT "6 Clicks" Mobility  Outcome Measure Help needed turning from your back to your side while in a flat bed without using bedrails?: None Help needed moving from lying on your back to sitting on the side of a flat bed without using bedrails?: None Help needed moving to and from a  bed to a chair (including a wheelchair)?: None Help needed standing up from a chair using your arms (e.g., wheelchair or bedside chair)?: None Help needed to walk in hospital room?: None Help needed climbing 3-5 steps with a railing? : None 6 Click Score: 24    End of Session   Activity Tolerance: Patient tolerated treatment well Patient left: Other (comment) (transport present to take patient) Nurse Communication: Mobility status PT Visit Diagnosis: Muscle weakness (generalized) (M62.81)    Time: 3875-6433 PT Time Calculation (min) (ACUTE ONLY): 10 min   Charges:   PT Evaluation $PT Eval Low Complexity: 1 Low          Minka Knight A. Dan Humphreys PT, DPT Acute Rehabilitation Services Pager [phone] Office [phone]   Viviann Spare 07/11/2021, 12:18 PM

## 2021-07-11 NOTE — Discharge Instructions (Signed)
Follow with Primary MD Octavio Graves P, DO in 7 days     Activity: As tolerated with Full fall precautions use walker/cane & assistance as needed   Disposition Home    Diet: Heart Healthy   On your next visit with your primary care physician please Get Medicines reviewed and adjusted.   Please request your Prim.MD to go over all Hospital Tests and Procedure/Radiological results at the follow up, please get all Hospital records sent to your Prim MD by signing hospital release before you go home.   If you experience worsening of your admission symptoms, develop shortness of breath, life threatening emergency, suicidal or homicidal thoughts you must seek medical attention immediately by calling 911 or calling your MD immediately  if symptoms less severe.  You Must read complete instructions/literature along with all the possible adverse reactions/side effects for all the Medicines you take and that have been prescribed to you. Take any new Medicines after you have completely understood and accpet all the possible adverse reactions/side effects.   Do not drive, operating heavy machinery, perform activities at heights, swimming or participation in water activities or provide baby sitting services if your were admitted for syncope or siezures until you have seen by Primary MD or a Neurologist and advised to do so again.  Do not drive when taking Pain medications.    Do not take more than prescribed Pain, Sleep and Anxiety Medications  Special Instructions: If you have smoked or chewed Tobacco  in the last 2 yrs please stop smoking, stop any regular Alcohol  and or any Recreational drug use.  Wear Seat belts while driving.   Please note  You were cared for by a hospitalist during your hospital stay. If you have any questions about your discharge medications or the care you received while you were in the hospital after you are discharged, you can call the unit and asked to speak with  the hospitalist on call if the hospitalist that took care of you is not available. Once you are discharged, your primary care physician will handle any further medical issues. Please note that NO REFILLS for any discharge medications will be authorized once you are discharged, as it is imperative that you return to your primary care physician (or establish a relationship with a primary care physician if you do not have one) for your aftercare needs so that they can reassess your need for medications and monitor your lab values.

## 2021-07-11 NOTE — Discharge Summary (Signed)
Physician Discharge Summary  Nancy Chambers NWG:[id number] DOB: [birth year] DOA: 07/10/2021  PCP: Hart Robinsons, DO  Admit date: 07/10/2021 Discharge date: 07/11/2021  Admitted From: Home Disposition:  Home  Recommendations for Outpatient Follow-up:  Follow up with PCP in 1-2 weeks Please LFTs in 4 to 6 weeks as she was started on statin  Home Health:NO  Discharge Condition:Stable CODE STATUS:FULL Diet recommendation: Heart Healthy   Brief/Interim Summary:   Nancy Chambers is a 66 y.o. female with medical history significant of hypothyroidism and depression.  Patient presents to the hospital with left arm heaviness and diaphoresis that started around 3:00.  The diaphoresis lasted for few minutes, but the left arm heaviness lasted for close to an hour.  Due to the symptoms, the patient did not feel safe to drive and had her sister-in-law drive her to the hospital for evaluation.  She did have a headache earlier today.  She took Tylenol with improvement.  She reports no previous episodes similar to this.  She does report a new medication -she started Wellbutrin approximately 4 to 5 days ago.  She has not experienced any side effects to the medication that she is aware of.  She has been on Wellbutrin before and does not remember any adverse side effects.  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 -A1c at 5.1 -No significant events on telemetry. -Blood pressure has been acceptable -We will discharge her on aspirin EC 81 mg oral daily  Hyperlipidemia -LDL at 138, started on statin  Hypothyroidism -Continue with home dose Synthroid.  Patient reports she requested Wellbutrin from PCP  last week not due to depression, but as it helped her to lose weight in the past, and have explained for her Wellbutrin is not indicated for weight loss, and I have requested her to follow with PCP regarding referral to obesity clinic.  I will DC Wellbutrin.   Discharge Diagnoses:  Principal Problem:   Left arm weakness Active Problems:   Depression, major, single episode, mild (HCC)   Hypothyroidism    Discharge Instructions  Discharge Instructions     Diet - low sodium heart healthy   Complete by: As directed    Discharge instructions   Complete by: As directed    Follow with Primary MD Hart Robinsons, DO in 7 days     Activity: As tolerated with Full fall precautions use walker/cane & assistance as needed   Disposition Home    Diet: Heart Healthy   On your next visit with your primary care physician please Get Medicines reviewed and adjusted.   Please request your Prim.MD to go over all Hospital Tests and Procedure/Radiological results at the follow up, please get all Hospital records sent to your Prim MD by signing hospital release before you go home.   If you experience worsening of your admission symptoms, develop shortness of breath, life threatening emergency, suicidal or homicidal thoughts you must seek medical attention immediately by calling 911 or calling your MD immediately  if symptoms less severe.  You Must read complete instructions/literature along with all the possible adverse reactions/side effects for all the Medicines you take and that have been prescribed to you. Take any new Medicines after you have  completely understood and accpet all the possible adverse reactions/side effects.   Do not drive, operating heavy machinery, perform activities at heights, swimming or participation in water activities or provide baby sitting services if your were admitted for syncope or siezures until you have seen by Primary MD or a Neurologist and advised to do so  again.  Do not drive when taking Pain medications.    Do not take more than prescribed Pain, Sleep and Anxiety Medications  Special Instructions: If you have smoked or chewed Tobacco  in the last 2 yrs please stop smoking, stop any regular Alcohol  and or any Recreational drug use.  Wear Seat belts while driving.   Please note  You were cared for by a hospitalist during your hospital stay. If you have any questions about your discharge medications or the care you received while you were in the hospital after you are discharged, you can call the unit and asked to speak with the hospitalist on call if the hospitalist that took care of you is not available. Once you are discharged, your primary care physician will handle any further medical issues. Please note that NO REFILLS for any discharge medications will be authorized once you are discharged, as it is imperative that you return to your primary care physician (or establish a relationship with a primary care physician if you do not have one) for your aftercare needs so that they can reassess your need for medications and monitor your lab values.   Increase activity slowly   Complete by: As directed       Allergies as of 07/11/2021       Reactions   Cymbalta [duloxetine Hcl] Nausea And Vomiting, Other (See Comments)   Stomach pain, kidney pain, choking   Paxil [paroxetine Hcl] Other (See Comments)   Weight gain   Wellbutrin [bupropion] Other (See Comments)   Feels too flirty (restarted 5/28 or 07/06/2021 - not sure yet if she can tolerate it)   Zoloft [sertraline Hcl] Other (See Comments)   Weight gain   Prozac [fluoxetine Hcl] Rash        Medication List     STOP taking these medications    buPROPion 150 MG 24 hr tablet Commonly known as: WELLBUTRIN XL       TAKE these medications    acetaminophen 650 MG CR tablet Commonly known as: TYLENOL Take 650-1,300 mg by mouth 2 (two) times daily as needed (headaches/back  pain).   aspirin EC 81 MG tablet Take 1 tablet (81 mg total) by mouth daily. Swallow whole.   atorvastatin 40 MG tablet Commonly known as: LIPITOR Take 1 tablet (40 mg total) by mouth daily.   levothyroxine 50 MCG tablet Commonly known as: SYNTHROID Take 25 mcg by mouth daily before breakfast. What changed: Another medication with the same name was removed. Continue taking this medication, and follow the directions you see here.   OVER THE COUNTER MEDICATION Take 1 capsule by mouth daily. Renew 180 supplement   VISINE OP Place 1 drop into both eyes daily as needed (red eyes).   Vitamin D3 125 MCG (5000 UT) Caps Take 5,000 Units by mouth 2 (two) times a week.        Allergies  Allergen Reactions   Cymbalta [Duloxetine Hcl] Nausea And Vomiting and Other (See Comments)    Stomach pain, kidney pain, choking   Paxil [Paroxetine Hcl] Other (See Comments)    Weight gain   Wellbutrin [Bupropion] Other (See Comments)  Feels too flirty (restarted 5/28 or 07/06/2021 - not sure yet if she can tolerate it)   Zoloft [Sertraline Hcl] Other (See Comments)    Weight gain   Prozac [Fluoxetine Hcl] Rash    Consultations: None   Procedures/Studies: DG Chest 2 View  Result Date: 07/10/2021 CLINICAL DATA:  Weakness EXAM: CHEST - 2 VIEW COMPARISON:  None Available. FINDINGS: The heart size and mediastinal contours are within normal limits. Both lungs are clear. The visualized skeletal structures are unremarkable. IMPRESSION: No active cardiopulmonary disease. Electronically Signed   By: Malachy Moan M.D.   On: 07/10/2021 16:40   CT Head Wo Contrast  Result Date: 07/10/2021 CLINICAL DATA:  Diaphoresis, left arm heaviness EXAM: CT HEAD WITHOUT CONTRAST TECHNIQUE: Contiguous axial images were obtained from the base of the skull through the vertex without intravenous contrast. RADIATION DOSE REDUCTION: This exam was performed according to the departmental dose-optimization program which  includes automated exposure control, adjustment of the mA and/or kV according to patient size and/or use of iterative reconstruction technique. COMPARISON:  CT head 07/11/2015 FINDINGS: Brain: There is no acute intracranial hemorrhage, extra-axial fluid collection, or acute infarct. Parenchymal volume is normal. The ventricles are normal in size. Gray-white differentiation is preserved. There is no mass lesion.  There is no mass effect or midline shift. Vascular: No hyperdense vessel or unexpected calcification. Skull: Normal. Negative for fracture or focal lesion. Sinuses/Orbits: The imaged paranasal sinuses are clear. The globes and orbits are unremarkable. Other: None. IMPRESSION: Normal head CT. Electronically Signed   By: Lesia Hausen M.D.   On: 07/10/2021 17:35   MR ANGIO HEAD WO CONTRAST  Result Date: 07/11/2021 CLINICAL DATA:  66 year old female with TIA, left arm weakness. EXAM: MRA HEAD WITHOUT CONTRAST TECHNIQUE: Angiographic images of the Circle of Willis were acquired using MRA technique without intravenous contrast. COMPARISON:  Brain MRI and Neck MRA today reported separately. FINDINGS: Anterior circulation: Antegrade flow in both ICA siphons. No siphon stenosis. Ophthalmic and posterior communicating artery origins appear normal. Patent carotid termini, MCA and ACA origins. Dominant appearing right ACA A1 segment. Anterior communicating artery and visible ACA branches are within normal limits. Bilateral MCA M1 segments and MCA trifurcations appear patent without stenosis. Visible bilateral MCA branches are within normal limits. Posterior circulation: Antegrade flow in the posterior circulation with fairly codominant distal vertebral arteries. No distal vertebral or vertebrobasilar junction stenosis. Normal PICA origins. Patent basilar artery without stenosis. Patent SCA and PCA origins. Tortuous right P1 segment with mild fetal type right PCA origin. Diminutive left posterior communicating artery.  Bilateral PCA branches are within normal limits. Anatomic variants: Dominant right ACA A1. Slightly fetal type right PCA origin. Other: No intracranial mass effect or ventriculomegaly. IMPRESSION: Negative intracranial MRA. Electronically Signed   By: Odessa Fleming M.D.   On: 07/11/2021 09:55   MR ANGIO NECK W CONTRAST  Result Date: 07/11/2021 CLINICAL DATA:  66 year old female with TIA, left arm weakness. EXAM: MRA NECK WITHOUT AND WITH CONTRAST TECHNIQUE: Angiographic images of the neck were acquired using MRA technique without and with intravenous contrast. Carotid stenosis measurements (when applicable) are obtained utilizing NASCET criteria, using the distal internal carotid diameter as the denominator. CONTRAST:  10mL GADAVIST GADOBUTROL 1 MMOL/ML IV SOLN COMPARISON:  Brain MRI today reported separately. FINDINGS: Precontrast time-of-flight images demonstrate antegrade flow in the bilateral cervical carotid and vertebral arteries to the skull base. Carotid bifurcations appear within normal limits. Right vertebral artery appears mildly dominant. Aortic arch: 3 vessel arch configuration,  within normal limits. Right carotid system: Mildly tortuous right CCA without stenosis. Minor irregularity at the right ICA origin and bulb without stenosis. Negative visible right ICA siphon and anterior circulation. Left carotid system: Tortuous proximal left CCA without evidence of stenosis. Minimal irregularity at the left ICA origin and bulb. No stenosis. Negative visible left ICA siphon and anterior circulation. Vertebral arteries: Proximal right subclavian artery, right vertebral artery origin, and cervical right vertebral artery are patent without stenosis. However, there is a subtle beaded appearance of the left vertebral distal V2 segment on series 405, image 6. Proximal left subclavian artery, left vertebral artery origin, and cervical left vertebral artery are patent without stenosis. There is a beaded appearance of the  left V3 segment (series 406, image 9). Other: Grossly negative visible neck. IMPRESSION: 1. Neck MRA suggests minimal cervical carotid atherosclerosis, no carotid stenosis. 2. Beaded appearance of the distal cervical vertebral arteries suspicious for Fibromuscular Dysplasia (FMD), but no associated stenosis. Electronically Signed   By: Odessa Fleming M.D.   On: 07/11/2021 09:52   MR BRAIN WO CONTRAST  Result Date: 07/11/2021 CLINICAL DATA:  66 year old female with TIA, left arm weakness. EXAM: MRI HEAD WITHOUT CONTRAST TECHNIQUE: Multiplanar, multiecho pulse sequences of the brain and surrounding structures were obtained without intravenous contrast. COMPARISON:  Head CT 07/10/2021. FINDINGS: Brain: Cerebral volume is within normal limits for age. No restricted diffusion to suggest acute infarction. No midline shift, mass effect, evidence of mass lesion, ventriculomegaly, extra-axial collection or acute intracranial hemorrhage. Cervicomedullary junction and pituitary are within normal limits. Largely normal for age gray and white matter signal throughout the brain. Minimal to mild nonspecific white matter T2 and FLAIR hyperintensity. No cortical encephalomalacia or chronic cerebral blood products. Deep gray matter nuclei, brainstem and cerebellum appear negative. Vascular: Major intracranial vascular flow voids are preserved. MRA today is reported separately. Skull and upper cervical spine: Normal for age visible cervical spine. Visualized bone marrow signal is within normal limits. Sinuses/Orbits: Negative. Paranasal sinuses and mastoids are stable and well aerated. Other: Grossly normal visible internal auditory structures. Negative visible scalp and face. IMPRESSION: No acute intracranial abnormality and essentially normal for age noncontrast MRI appearance of the brain. Electronically Signed   By: Odessa Fleming M.D.   On: 07/11/2021 09:48   ECHOCARDIOGRAM COMPLETE  Result Date: 07/11/2021    ECHOCARDIOGRAM REPORT    Patient Name:   Nancy Chambers Date of Exam: 07/11/2021 Medical Rec #:  [id number]    Height:       66.0 in Accession #:    [id number]   Weight:       240.5 lb Date of Birth:  [birth year]     BSA:          2.163 m Patient Age:    66 years     BP:           114/69 mmHg Patient Gender: F            HR:           70 bpm. Exam Location:  Inpatient Procedure: 2D Echo Indications:    TIA  History:        Patient has no prior history of Echocardiogram examinations.  Sonographer:    [name] RDCS Referring Phys: [id number] JACOB J STINSON  Sonographer Comments: Image acquisition challenging due to patient body habitus. IMPRESSIONS  1. Left ventricular ejection fraction, by estimation, is 60 to 65%. The left ventricle has normal function. The left ventricle  has no regional wall motion abnormalities. Left ventricular diastolic parameters were normal.  2. Right ventricular systolic function is normal. The right ventricular size is normal.  3. The mitral valve is normal in structure. Trivial mitral valve regurgitation. No evidence of mitral stenosis.  4. The aortic valve is normal in structure. Aortic valve regurgitation is not visualized. No aortic stenosis is present.  5. The inferior vena cava is normal in size with greater than 50% respiratory variability, suggesting right atrial pressure of 3 mmHg. FINDINGS  Left Ventricle: Left ventricular ejection fraction, by estimation, is 60 to 65%. The left ventricle has normal function. The left ventricle has no regional wall motion abnormalities. The left ventricular internal cavity size was normal in size. There is  no left ventricular hypertrophy. Left ventricular diastolic parameters were normal. Right Ventricle: The right ventricular size is normal. No increase in right ventricular wall thickness. Right ventricular systolic function is normal. Left Atrium: Left atrial size was normal in size. Right Atrium: Right atrial size was normal in size. Pericardium: There is no evidence of  pericardial effusion. Mitral Valve: The mitral valve is normal in structure. Trivial mitral valve regurgitation. No evidence of mitral valve stenosis. Tricuspid Valve: The tricuspid valve is normal in structure. Tricuspid valve regurgitation is not demonstrated. No evidence of tricuspid stenosis. Aortic Valve: The aortic valve is normal in structure. Aortic valve regurgitation is not visualized. No aortic stenosis is present. Pulmonic Valve: The pulmonic valve was normal in structure. Pulmonic valve regurgitation is not visualized. No evidence of pulmonic stenosis. Aorta: The aortic root is normal in size and structure. Venous: The inferior vena cava is normal in size with greater than 50% respiratory variability, suggesting right atrial pressure of 3 mmHg. IAS/Shunts: No atrial level shunt detected by color flow Doppler.  LEFT VENTRICLE PLAX 2D LVIDd:         4.10 cm Diastology LVIDs:         2.80 cm LV e' medial:    11.60 cm/s LV PW:         0.90 cm LV E/e' medial:  6.8 LV IVS:        0.80 cm LV e' lateral:   13.70 cm/s                        LV E/e' lateral: 5.8  RIGHT VENTRICLE             IVC RV S prime:     14.80 cm/s  IVC diam: 1.30 cm TAPSE (M-mode): 2.3 cm LEFT ATRIUM             Index        RIGHT ATRIUM           Index LA diam:        2.90 cm 1.34 cm/m   RA Area:     14.50 cm LA Vol (A2C):   36.7 ml 16.97 ml/m  RA Volume:   35.10 ml  16.23 ml/m LA Vol (A4C):   43.9 ml 20.30 ml/m LA Biplane Vol: 42.3 ml 19.56 ml/m  AORTIC VALVE LVOT Vmax:   101.00 cm/s LVOT Vmean:  67.600 cm/s LVOT VTI:    0.220 m  AORTA Ao Asc diam: 3.00 cm MITRAL VALVE MV Area (PHT): 4.31 cm    SHUNTS MV Decel Time: 176 msec    Systemic VTI: 0.22 m MV E velocity: 79.10 cm/s MV A velocity: 93.30 cm/s MV E/A ratio:  0.85 Mihai  Croitoru MD Electronically signed by Thurmon Fair MD Signature Date/Time: 07/11/2021/1:29:53 PM    Final       Subjective:  Any focal deficits, no tingling, no numbness, all weakness has  resolved. Discharge Exam: Vitals:   07/11/21 0800 07/11/21 1149  BP: 114/69 118/60  Pulse: 74 76  Resp: 13 19  Temp: 97.9 F (36.6 C) 98 F (36.7 C)  SpO2:     Vitals:   07/11/21 0600 07/11/21 0700 07/11/21 0800 07/11/21 1149  BP:   114/69 118/60  Pulse: 81 79 74 76  Resp: Temp:   97.9 F (36.6 C) 98 F (36.7 C)  TempSrc:   Oral Oral  SpO2:      Weight:      Height:        General: Pt is alert, awake, not in acute distress Cardiovascular: RRR, S1/S2 +, no rubs, no gallops Respiratory: CTA bilaterally, no wheezing, no rhonchi Abdominal: Soft, NT, ND, bowel sounds + Extremities: no edema, no cyanosis    The results of significant diagnostics from this hospitalization (including imaging, microbiology, ancillary and laboratory) are listed below for reference.     Microbiology: No results found for this or any previous visit (from the past 240 hour(s)).   Labs: BNP (last 3 results) No results for input(s): BNP in the last 8760 hours. Basic Metabolic Panel: Recent Labs  Lab 07/10/21 1642  NA 141  K 3.6  CL 109  CO2 27  GLUCOSE 103*  BUN 14  CREATININE 1.28*  CALCIUM 9.0   Liver Function Tests: Recent Labs  Lab 07/10/21 1642  AST 21  ALT 22  ALKPHOS 57  BILITOT 0.6  PROT 6.8  ALBUMIN 4.2   No results for input(s): LIPASE, AMYLASE in the last 168 hours. No results for input(s): AMMONIA in the last 168 hours. CBC: Recent Labs  Lab 07/10/21 1642  WBC 4.4  NEUTROABS 2.6  HGB 13.9  HCT 42.6  MCV 94.2  PLT 241   Cardiac Enzymes: No results for input(s): CKTOTAL, CKMB, CKMBINDEX, TROPONINI in the last 168 hours. BNP: Invalid input(s): POCBNP CBG: No results for input(s): GLUCAP in the last 168 hours. D-Dimer No results for input(s): DDIMER in the last 72 hours. Hgb A1c Recent Labs    07/10/21 2354  HGBA1C 5.1   Lipid Profile Recent Labs    07/10/21 2354  CHOL 203*  HDL 51  LDLCALC 138*  TRIG 68  CHOLHDL 4.0    Thyroid function studies Recent Labs    07/10/21 2354  TSH 3.677   Anemia work up No results for input(s): VITAMINB12, FOLATE, FERRITIN, TIBC, IRON, RETICCTPCT in the last 72 hours. Urinalysis    Component Value Date/Time   COLORURINE YELLOW 07/11/2015 0731   APPEARANCEUR Clear 11/26/2015 1042   LABSPEC 1.013 07/11/2015 0731   PHURINE 6.0 07/11/2015 0731   GLUCOSEU Negative 11/26/2015 1042   HGBUR NEGATIVE 07/11/2015 0731   BILIRUBINUR Negative 11/26/2015 1042   KETONESUR NEGATIVE 07/11/2015 0731   PROTEINUR Negative 11/26/2015 1042   PROTEINUR NEGATIVE 07/11/2015 0731   UROBILINOGEN 0.2 09/13/2013 1509   NITRITE Negative 11/26/2015 1042   NITRITE NEGATIVE 07/11/2015 0731   LEUKOCYTESUR Negative 11/26/2015 1042   Sepsis Labs Invalid input(s): PROCALCITONIN,  WBC,  LACTICIDVEN Microbiology No results found for this or any previous visit (from the past 240 hour(s)).   Time coordinating discharge: Over 30 minutes  SIGNED:   Huey Bienenstock, MD  [facility] 07/11/2021, 2:28 PM  Pager   If 7PM-7AM, please contact night-coverage [email or web address] Password [email or web address]

## 2021-07-11 NOTE — Progress Notes (Signed)
  Echocardiogram 2D Echocardiogram has been performed.  [name] 07/11/2021, 11:43 AM

## 2021-07-11 NOTE — Progress Notes (Signed)
SLP Cancellation Note  Patient Details Name: Nancy Chambers MRN: [id number] DOB: 1955-03-25   Cancelled treatment:       Reason Eval/Treat Not Completed: SLP screened, no needs identified, will sign off  Rhilyn Battle L. Tivis Ringer, [place] CCC/SLP [facility] Services Office number [phone] Pager [phone]  Juan Quam Laurice 07/11/2021, 1:51 PM

## 2021-07-13 LAB — T3, FREE: T3, Free: 2.7 pg/mL (ref 2.0–4.4)

## 2021-07-23 DIAGNOSIS — R69 Illness, unspecified: Secondary | ICD-10-CM | POA: Diagnosis not present

## 2021-07-23 DIAGNOSIS — E538 Deficiency of other specified B group vitamins: Secondary | ICD-10-CM | POA: Diagnosis not present

## 2021-07-23 DIAGNOSIS — E039 Hypothyroidism, unspecified: Secondary | ICD-10-CM | POA: Diagnosis not present

## 2021-07-23 DIAGNOSIS — E782 Mixed hyperlipidemia: Secondary | ICD-10-CM | POA: Diagnosis not present

## 2021-08-25 DIAGNOSIS — R5383 Other fatigue: Secondary | ICD-10-CM | POA: Diagnosis not present

## 2021-08-25 DIAGNOSIS — E782 Mixed hyperlipidemia: Secondary | ICD-10-CM | POA: Diagnosis not present

## 2021-08-25 DIAGNOSIS — R69 Illness, unspecified: Secondary | ICD-10-CM | POA: Diagnosis not present

## 2021-08-31 DIAGNOSIS — R300 Dysuria: Secondary | ICD-10-CM | POA: Diagnosis not present
